# Patient Record
Sex: Female | Born: 1990 | Race: Black or African American | Hispanic: No | Marital: Single | Smoking: Current every day smoker
Health system: Southern US, Community
[De-identification: ages and names within clinical notes are randomized; demographics above are authoritative.]

## PROBLEM LIST (undated history)

## (undated) HISTORY — PX: NO PAST SURGERIES: SHX2092

## (undated) HISTORY — PX: CHOLECYSTECTOMY: SHX55

---
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
## 2010-05-05 LAB — URINALYSIS, ROUTINE W REFLEX MICROSCOPIC
Glucose, UA: NEGATIVE mg/dL
Nitrite: NEGATIVE
pH: 7 (ref 5.0–8.0)

## 2010-05-10 LAB — COMPREHENSIVE METABOLIC PANEL
ALT: 11 U/L (ref 0–35)
ALT: 17 U/L (ref 0–35)
Albumin: 4.7 g/dL (ref 3.5–5.2)
Alkaline Phosphatase: 59 U/L (ref 39–117)
Alkaline Phosphatase: 69 U/L (ref 39–117)
BUN: 5 mg/dL — ABNORMAL LOW (ref 6–23)
BUN: 6 mg/dL (ref 6–23)
BUN: 7 mg/dL (ref 6–23)
Calcium: 9.4 mg/dL (ref 8.4–10.5)
Chloride: 107 mEq/L (ref 96–112)
Creatinine, Ser: 0.63 mg/dL (ref 0.4–1.2)
GFR calc non Af Amer: >60 mL/min (ref >60)
Glucose, Bld: 102 mg/dL — ABNORMAL HIGH (ref 70–99)
Glucose, Bld: 110 mg/dL — ABNORMAL HIGH (ref 70–99)
Potassium: 3.3 mEq/L — ABNORMAL LOW (ref 3.5–5.1)
Potassium: 3 mEq/L — ABNORMAL LOW (ref 3.5–5.1)
Sodium: 138 mEq/L (ref 135–145)
Sodium: 138 mEq/L (ref 135–145)
Sodium: 140 mEq/L (ref 135–145)
Total Bilirubin: 2 mg/dL — ABNORMAL HIGH (ref 0.3–1.2)
Total Protein: 7 g/dL (ref 6.0–8.3)
Total Protein: 7.1 g/dL (ref 6.0–8.3)
Total Protein: 8 g/dL (ref 6.0–8.3)

## 2010-05-10 LAB — DIFFERENTIAL
Basophils Absolute: 0 10*3/uL (ref 0.0–0.1)
Basophils Relative: 0 % (ref 0–1)
Basophils Relative: 0 % (ref 0–1)
Eosinophils Absolute: 0 10*3/uL (ref 0.0–0.7)
Lymphocytes Relative: 8 % — ABNORMAL LOW (ref 12–46)
Lymphocytes Relative: 7 % — ABNORMAL LOW (ref 12–46)
Lymphs Abs: 0.9 10*3/uL (ref 0.7–4.0)
Monocytes Absolute: 0.5 10*3/uL (ref 0.1–1.0)
Monocytes Absolute: 0.4 10*3/uL (ref 0.1–1.0)
Monocytes Relative: 3 % (ref 3–12)
Monocytes Relative: 4 % (ref 3–12)
Monocytes Relative: 2 % — ABNORMAL LOW (ref 3–12)
Neutro Abs: 10.4 10*3/uL — ABNORMAL HIGH (ref 1.7–7.7)
Neutro Abs: 12.6 10*3/uL — ABNORMAL HIGH (ref 1.7–7.7)
Neutro Abs: 16.3 10*3/uL — ABNORMAL HIGH (ref 1.7–7.7)
Neutrophils Relative %: 87 % — ABNORMAL HIGH (ref 43–77)
Neutrophils Relative %: 89 % — ABNORMAL HIGH (ref 43–77)

## 2010-05-10 LAB — CBC
HCT: 37.6 % (ref 36.0–46.0)
HCT: 39.8 % (ref 36.0–46.0)
Hemoglobin: 12.9 g/dL (ref 12.0–15.0)
Hemoglobin: 12.9 g/dL (ref 12.0–15.0)
MCHC: 34.2 g/dL (ref 30.0–36.0)
MCV: 89.9 fL (ref 78.0–100.0)
Platelets: 316 10*3/uL (ref 150–400)
RBC: 4.18 MIL/uL (ref 3.87–5.11)
RDW: 14 % (ref 11.5–15.5)
RDW: 13.8 % (ref 11.5–15.5)
WBC: 14.5 10*3/uL — ABNORMAL HIGH (ref 4.0–10.5)

## 2010-05-10 LAB — URINALYSIS, ROUTINE W REFLEX MICROSCOPIC
Bilirubin Urine: NEGATIVE
Glucose, UA: NEGATIVE mg/dL
Glucose, UA: NEGATIVE mg/dL
Hgb urine dipstick: NEGATIVE
Ketones, ur: >80 mg/dL — AB
Ketones, ur: 40 mg/dL — AB
Leukocytes, UA: NEGATIVE
Nitrite: POSITIVE — AB
Protein, ur: 30 mg/dL — AB
Protein, ur: NEGATIVE mg/dL
Protein, ur: 100 mg/dL — AB
Specific Gravity, Urine: 1.02 (ref 1.005–1.030)
Urobilinogen, UA: 0.2 mg/dL (ref 0.0–1.0)
Urobilinogen, UA: 1 mg/dL (ref 0.0–1.0)
pH: >9 — ABNORMAL HIGH (ref 5.0–8.0)

## 2010-05-10 LAB — LIPASE, BLOOD: Lipase: 17 U/L (ref 11–59)

## 2010-05-10 LAB — URINE MICROSCOPIC-ADD ON

## 2010-05-10 LAB — RAPID URINE DRUG SCREEN, HOSP PERFORMED
Amphetamines: NOT DETECTED
Barbiturates: NOT DETECTED
Benzodiazepines: POSITIVE — AB
Cocaine: NOT DETECTED
Opiates: POSITIVE — AB

## 2010-05-10 LAB — PREGNANCY, URINE: Preg Test, Ur: NEGATIVE

## 2010-05-10 LAB — AMYLASE: Amylase: 95 U/L (ref 27–131)

## 2010-05-16 LAB — URINALYSIS, ROUTINE W REFLEX MICROSCOPIC
Glucose, UA: NEGATIVE mg/dL
Specific Gravity, Urine: >1.03 — ABNORMAL HIGH (ref 1.005–1.030)
pH: 5.5 (ref 5.0–8.0)

## 2010-05-16 LAB — URINE MICROSCOPIC-ADD ON

## 2010-05-16 LAB — URINE CULTURE

## 2010-10-19 LAB — URINALYSIS, ROUTINE W REFLEX MICROSCOPIC
Glucose, UA: NEGATIVE
Hgb urine dipstick: NEGATIVE
Specific Gravity, Urine: >1.03 — ABNORMAL HIGH

## 2010-10-19 LAB — RAPID STREP SCREEN (MED CTR MEBANE ONLY)

## 2010-11-15 LAB — COMPREHENSIVE METABOLIC PANEL
ALT: 13
Albumin: 4.4
Alkaline Phosphatase: 73
BUN: 8
Chloride: 106
Glucose, Bld: 111 — ABNORMAL HIGH
Potassium: 3.4 — ABNORMAL LOW
Sodium: 137
Total Bilirubin: 1.6 — ABNORMAL HIGH

## 2010-11-15 LAB — CBC
HCT: 35.3 — ABNORMAL LOW
Hemoglobin: 12.2
Platelets: 311
WBC: 12.6 — ABNORMAL HIGH

## 2010-11-15 LAB — URINALYSIS, ROUTINE W REFLEX MICROSCOPIC
Bilirubin Urine: NEGATIVE
Glucose, UA: NEGATIVE
Hgb urine dipstick: NEGATIVE
Ketones, ur: >80 — AB
Protein, ur: 30 — AB
Urobilinogen, UA: 0.2

## 2010-11-15 LAB — DIFFERENTIAL
Basophils Absolute: 0
Basophils Relative: 0
Eosinophils Absolute: 0
Monocytes Absolute: 0.7
Neutro Abs: 10.7 — ABNORMAL HIGH

## 2011-01-18 IMAGING — US US PELVIS COMPLETE
1 series · 13 of 25 positions shown · non-contrast
Comparison: None.
COMPARISON: None.

CLINICAL DATA: Abdominal pain.  Vomiting and chills.

ABDOMEN ULTRASOUND
TECHNIQUE: Complete abdominal ultrasound examination was performed
including evaluation of the liver, gallbladder, bile ducts,
pancreas, kidneys, spleen, IVC, and abdominal aorta.
CLINICAL DATA: Pelvic pain.
TRANSABDOMINAL AND TRANSVAGINAL ULTRASOUND OF PELVIS
TECHNIQUE: Both transabdominal and transvaginal ultrasound
examinations of the pelvis were performed including evaluation of
the uterus, ovaries, adnexal regions, and pelvic cul-de-sac.

[Series 1: unknown · 0.26mm/px · 13 of 47 slices shown]
[im 1/47]
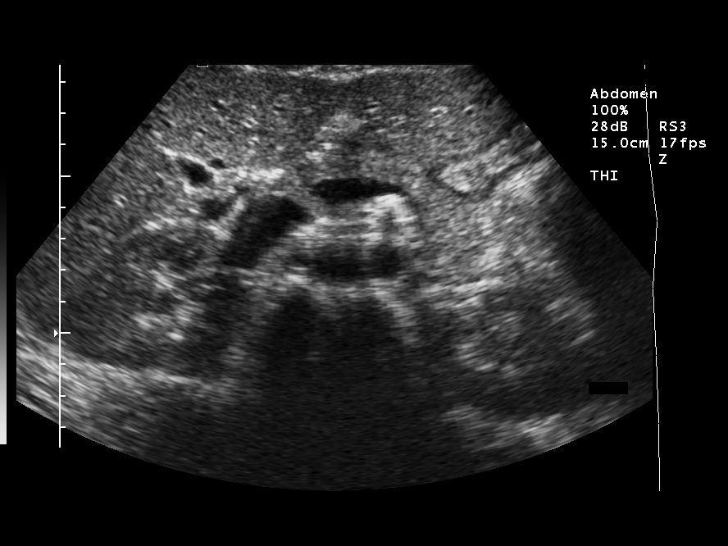
[im 4/47]
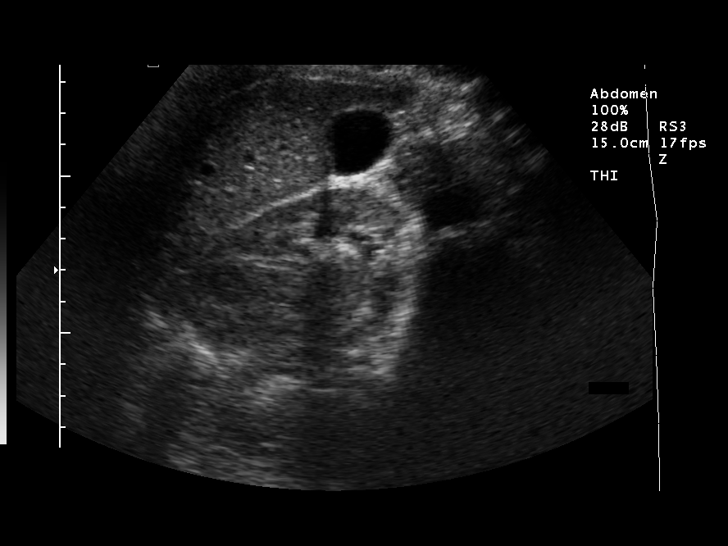
[im 8/47]
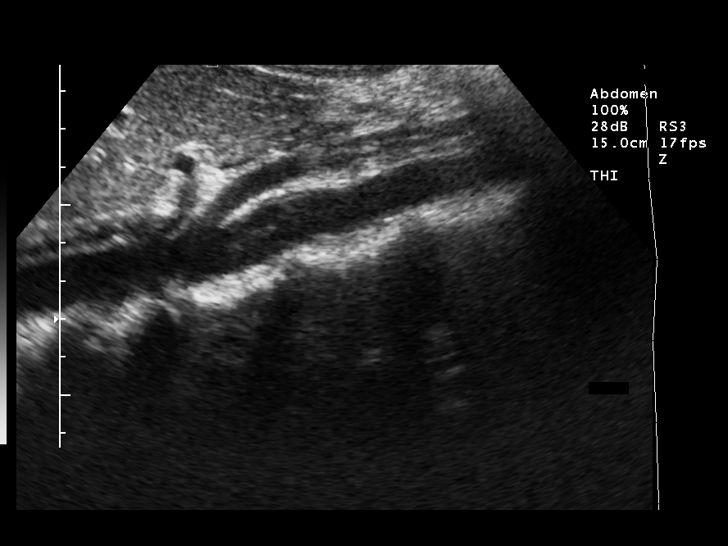
[im 12/47]
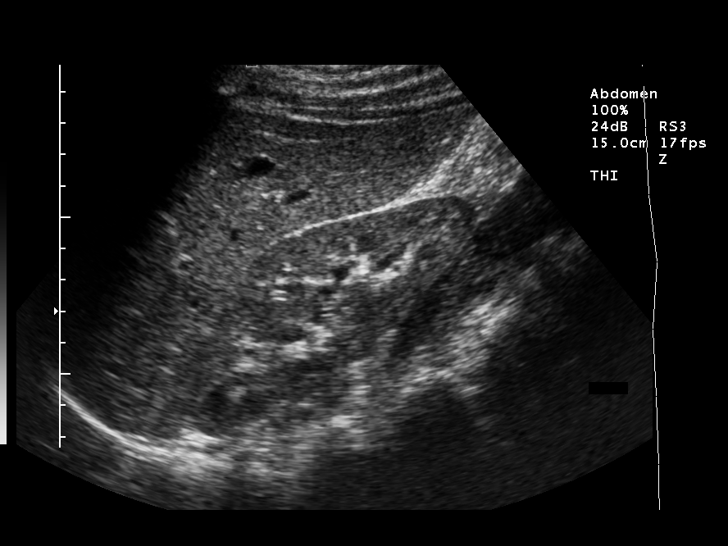
[im 16/47]
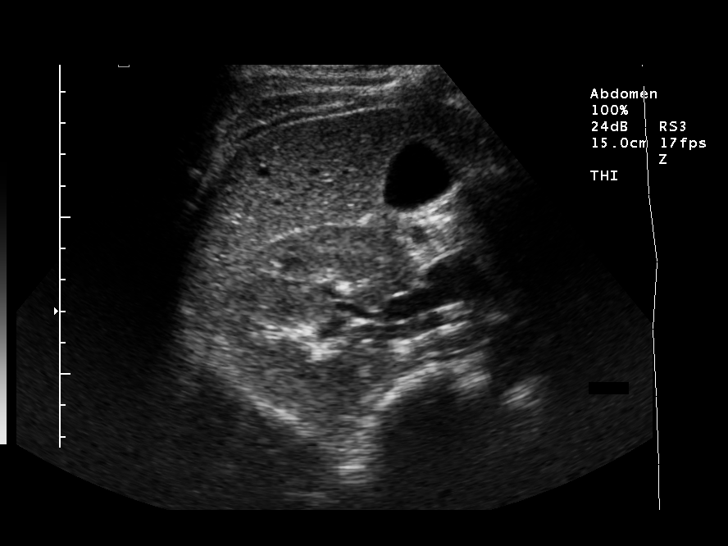
[im 20/47]
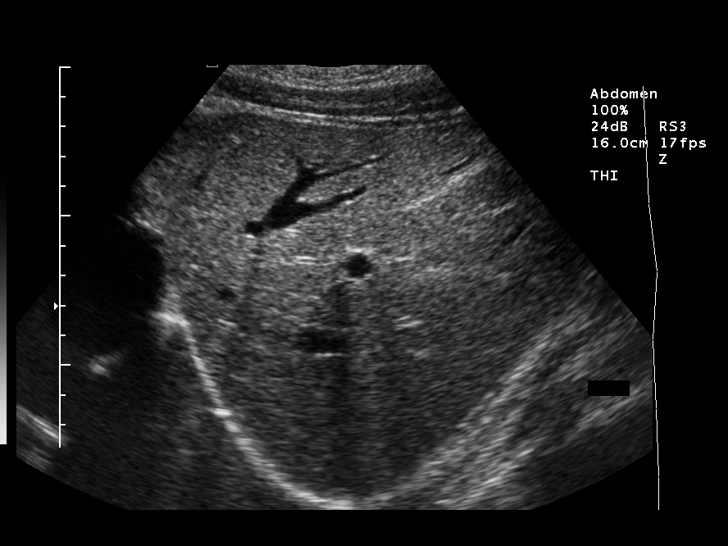
[im 24/47]
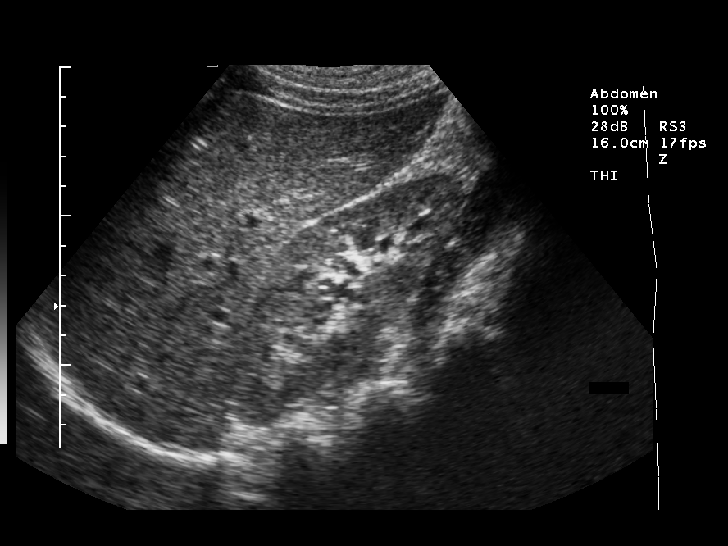
[im 27/47]
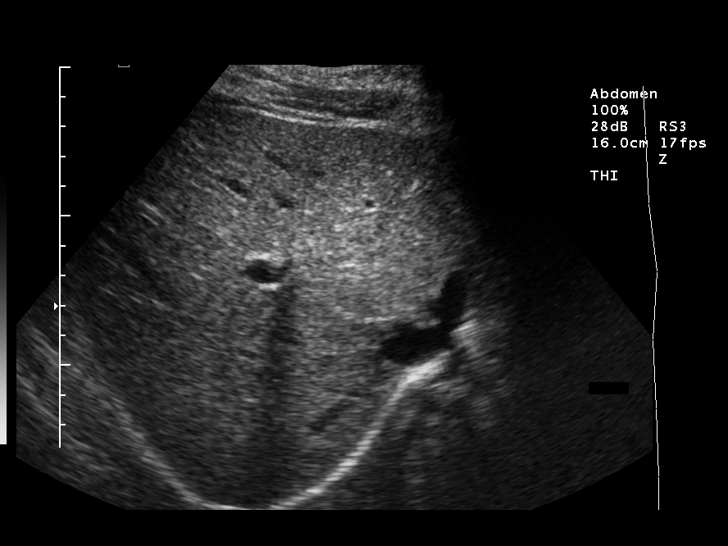
[im 31/47]
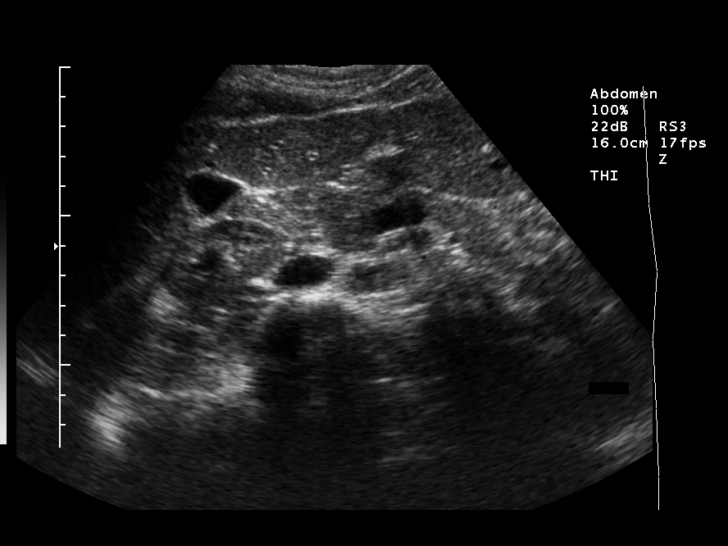
[im 35/47]
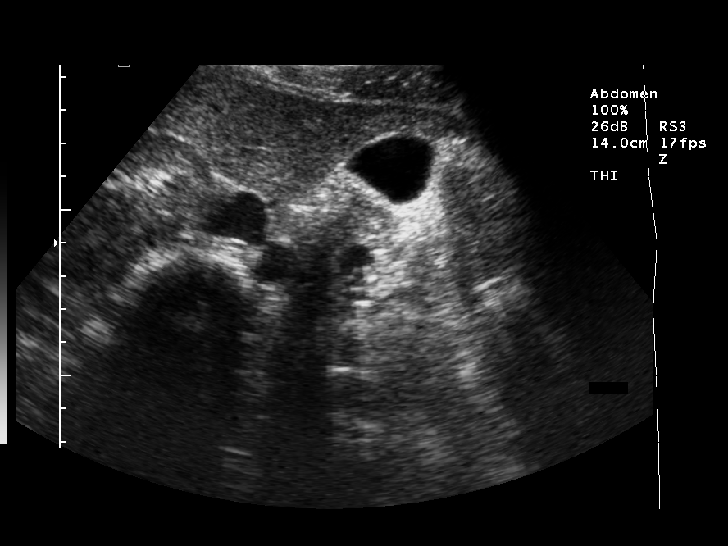
[im 39/47]
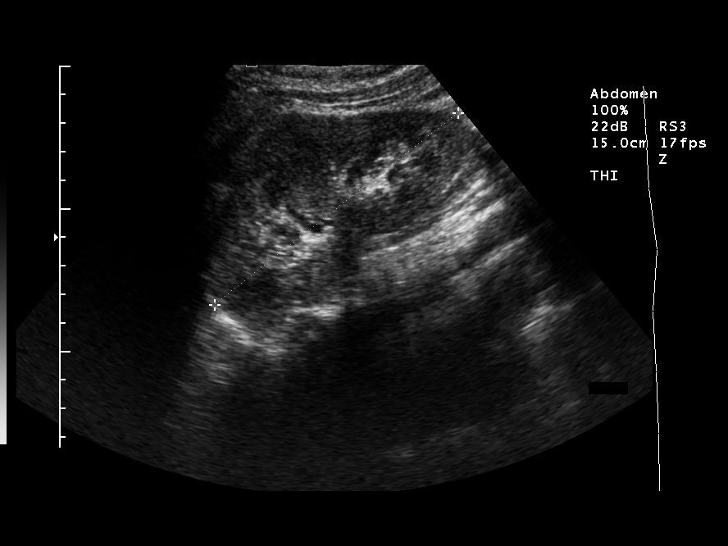
[im 43/47]
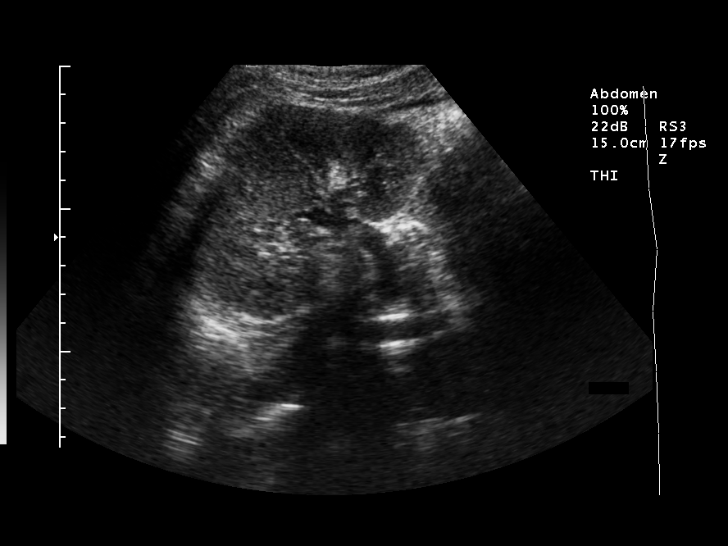
[im 47/47]
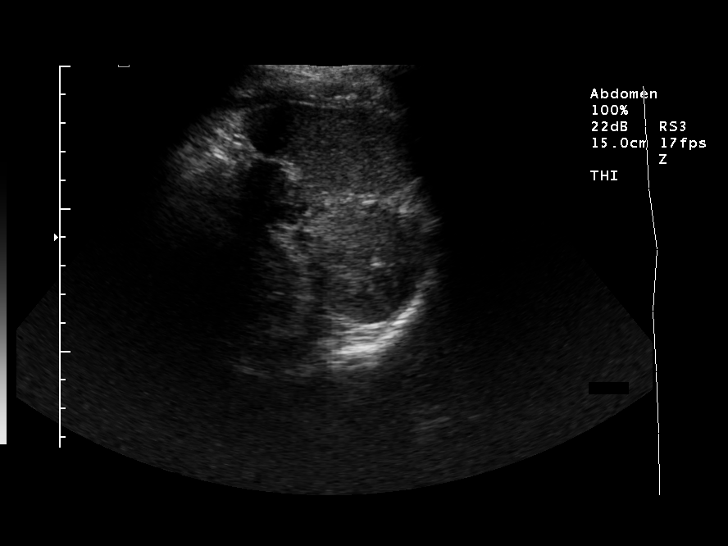

[13 of 25 positions shown; findings below may reference images not displayed]

FINDINGS: Gallbladder:  There is no evidence for gallstones, gallbladder wall
thickening or pericholecystic fluid.  The sonographer reports no
sonographic Murphy's sign.

Common Bile Duct:  Nondilated at 2 mm diameter

Liver:  Normal

IVC:  Normal

Pancreas:  Normal

Spleen:  Normal

Right kidney:  1.2 cm in long axis.  Normal

Left kidney:  10.8 cm in long axis.  Normal

Abdominal Aorta:  No aneurysm
IMPRESSION: Normal abdominal ultrasound.

CT would be a more sensitive means to evaluate the bowel and
peritoneal cavity.
FINDINGS: The uterus measures 6.1 x 3.3 x 3.9 cm.  Myometrial echotexture is
homogeneous.  No evidence for fibroids.  Endometrial stripe
thickness is 5 mm.

The right ovary measures 3.4 x 2.0 x 2.2 cm.  The left ovary
measures 1.8 x 1.7 x 2.7 cm.  Both ovaries are sonographically
normal.  No evidence for intraperitoneal free fluid.
IMPRESSION: Normal pelvic ultrasound.

## 2011-03-27 ENCOUNTER — Encounter (HOSPITAL_COMMUNITY): Payer: Self-pay | Admitting: *Deleted

## 2011-03-27 ENCOUNTER — Emergency Department (HOSPITAL_COMMUNITY)
Admission: EM | Admit: 2011-03-27 | Discharge: 2011-03-27 | Disposition: A | Discharge status: Home Health | Payer: Self-pay | Attending: Emergency Medicine | Admitting: Emergency Medicine

## 2011-03-27 DIAGNOSIS — H5710 Ocular pain, unspecified eye: Secondary | ICD-10-CM | POA: Insufficient documentation

## 2011-03-27 DIAGNOSIS — H5789 Other specified disorders of eye and adnexa: Secondary | ICD-10-CM | POA: Insufficient documentation

## 2011-03-27 DIAGNOSIS — F17200 Nicotine dependence, unspecified, uncomplicated: Secondary | ICD-10-CM | POA: Insufficient documentation

## 2011-03-27 DIAGNOSIS — H00019 Hordeolum externum unspecified eye, unspecified eyelid: Secondary | ICD-10-CM | POA: Insufficient documentation

## 2011-03-27 MED ORDER — TOBRAMYCIN 0.3 % OP OINT
TOPICAL_OINTMENT | Freq: Three times a day (TID) | OPHTHALMIC | Status: DC
  Administered 2011-03-27: 16:00:00 via OPHTHALMIC
  Filled 2011-03-27 (×3): qty 3.5

## 2011-03-27 MED ORDER — TOBRAMYCIN 0.3 % OP SOLN
OPHTHALMIC | Status: AC
  Filled 2011-03-27: qty 5

## 2011-03-27 NOTE — ED Notes (Signed)
Right eye pain that started last night.  Swelling this morning.  Denies drainage, denies visual disturbances.

## 2011-03-27 NOTE — ED Notes (Signed)
Awaiting medication from pharmacy.

## 2011-03-27 NOTE — ED Provider Notes (Signed)
History     CSN: [id number]  Arrival date & time 03/27/11  1456   First MD Initiated Contact with Patient 03/27/11 1523      Chief Complaint  Patient presents with  . Eye Pain    (Consider location/radiation/quality/duration/timing/severity/associated sxs/prior treatment) Patient is a 21 y.o. female presenting with eye pain. The history is provided by the patient.  Eye Pain This is a new problem. The current episode started yesterday. The problem occurs constantly. The problem has been gradually worsening. Pertinent negatives include no abdominal pain, arthralgias, chest pain, congestion, coughing, fever, headaches, joint swelling, nausea, neck pain, numbness, rash, sore throat, swollen glands or weakness. Associated symptoms comments: Swelling of right upper eyelid.  She denies foreign body sensation.  No discharge from either eye.. Exacerbated by: Eyelid is tender to palpation, blinking also increases discomfort. She has tried nothing for the symptoms.    History reviewed. No pertinent past medical history.  History reviewed. No pertinent past surgical history.  No family history on file.  History  Substance Use Topics  . Smoking status: Current Everyday Smoker    Types: Cigarettes  . Smokeless tobacco: Not on file  . Alcohol Use: No    OB History    Grav Para Term Preterm Abortions TAB SAB Ect Mult Living                  Review of Systems  Constitutional: Negative for fever.  HENT: Negative for congestion, sore throat and neck pain.   Eyes: Positive for pain. Negative for photophobia, discharge, redness, itching and visual disturbance.       Eyelid pain and swelling.  Respiratory: Negative for cough, chest tightness and shortness of breath.   Cardiovascular: Negative for chest pain.  Gastrointestinal: Negative for nausea and abdominal pain.  Genitourinary: Negative.   Musculoskeletal: Negative for joint swelling and arthralgias.  Skin: Negative.  Negative for  rash and wound.  Neurological: Negative for dizziness, weakness, light-headedness, numbness and headaches.  Hematological: Negative.   Psychiatric/Behavioral: Negative.     Allergies  Review of patient's allergies indicates no known allergies.  Home Medications   Current Outpatient Rx  Name Route Sig Dispense Refill  . TRIAMCINOLONE ACETONIDE 0.1 % EX CREA Topical Apply 1 application topically 2 (two) times daily.      BP 90/63  Pulse 89  Temp(Src) 97.8 F (36.6 C) (Oral)  Resp 18  Ht 5\' 2"  (1.575 m)  Wt 125 lb (56.7 kg)  BMI 22.86 kg/m2  SpO2 100%  Physical Exam  Nursing note and vitals reviewed. Constitutional: She is oriented to person, place, and time. She appears well-developed and well-nourished. No distress.  HENT:  Head: Normocephalic and atraumatic.  Eyes: Conjunctivae and EOM are normal. Pupils are equal, round, and reactive to light. Right eye exhibits hordeolum. Right eye exhibits no discharge. Left eye exhibits no discharge.         Visual acuity 20/15 OS/OD  Neck: Normal range of motion.  Cardiovascular: Normal rate, regular rhythm, normal heart sounds and intact distal pulses.   Pulmonary/Chest: Effort normal and breath sounds normal. She has no wheezes.  Abdominal: Soft. Bowel sounds are normal. There is no tenderness.  Musculoskeletal: Normal range of motion.  Neurological: She is alert and oriented to person, place, and time.  Skin: Skin is warm and dry.  Psychiatric: She has a normal mood and affect.    ED Course  Procedures (including critical care time)  Labs Reviewed - No data to  display No results found.   1. Stye       MDM  Encouraged warm compresses and washing eyelids with baby shampoo.  Given Tobrex ophthalmic ointment to apply a thin coat along the eyelid margins.  Referral to Dr. Bing Plume of ophthalmology if symptoms do not improve or if they worsen over the next week.        Candis Musa, PA 03/27/11 1716  Candis Musa,  PA 03/27/11 [phone]

## 2011-03-28 NOTE — ED Provider Notes (Signed)
Medical screening examination/treatment/procedure(s) were performed by non-physician practitioner and as supervising physician I was immediately available for consultation/collaboration.  Zawadi Aplin T Raye Wiens, MD 03/28/11 2005 

## 2011-05-07 ENCOUNTER — Emergency Department (HOSPITAL_COMMUNITY): Payer: No Typology Code available for payment source

## 2011-05-07 ENCOUNTER — Encounter (HOSPITAL_COMMUNITY): Payer: Self-pay | Admitting: *Deleted

## 2011-05-07 ENCOUNTER — Emergency Department (HOSPITAL_COMMUNITY)
Admission: EM | Admit: 2011-05-07 | Discharge: 2011-05-07 | Disposition: A | Discharge status: Home / Self Care | Payer: No Typology Code available for payment source | Attending: Emergency Medicine | Admitting: Emergency Medicine

## 2011-05-07 DIAGNOSIS — S9030XA Contusion of unspecified foot, initial encounter: Secondary | ICD-10-CM

## 2011-05-07 DIAGNOSIS — Y92410 Unspecified street and highway as the place of occurrence of the external cause: Secondary | ICD-10-CM | POA: Insufficient documentation

## 2011-05-07 MED ORDER — ACETAMINOPHEN 500 MG PO TABS
1000.0000 mg | ORAL_TABLET | Freq: Once | ORAL | Status: AC
  Administered 2011-05-07: 1000 mg via ORAL
  Filled 2011-05-07: qty 2
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
## 2011-05-08 NOTE — ED Provider Notes (Signed)
Medical screening examination/treatment/procedure(s) were performed by non-physician practitioner and as supervising physician I was immediately available for consultation/collaboration. Devoria Albe, MD, FACEP   Ward Givens, MD 05/08/11 [phone]

## 2011-06-13 ENCOUNTER — Emergency Department (HOSPITAL_COMMUNITY)
Admission: EM | Admit: 2011-06-13 | Discharge: 2011-06-13 | Disposition: A | Discharge status: Home / Self Care | Payer: Self-pay | Attending: Emergency Medicine | Admitting: Emergency Medicine

## 2011-06-13 ENCOUNTER — Encounter (HOSPITAL_COMMUNITY): Payer: Self-pay

## 2011-06-13 DIAGNOSIS — R112 Nausea with vomiting, unspecified: Secondary | ICD-10-CM | POA: Insufficient documentation

## 2011-06-13 DIAGNOSIS — R1013 Epigastric pain: Secondary | ICD-10-CM | POA: Insufficient documentation

## 2011-06-13 LAB — COMPREHENSIVE METABOLIC PANEL
ALT: 21 U/L (ref 0–35)
AST: 22 U/L (ref 0–37)
Alkaline Phosphatase: 66 U/L (ref 39–117)
CO2: 22 mEq/L (ref 19–32)
GFR calc Af Amer: >90 mL/min (ref >90)
GFR calc non Af Amer: >90 mL/min (ref >90)
Glucose, Bld: 78 mg/dL (ref 70–99)
Potassium: 3.5 mEq/L (ref 3.5–5.1)
Sodium: 138 mEq/L (ref 135–145)

## 2011-06-13 LAB — URINALYSIS, ROUTINE W REFLEX MICROSCOPIC
Glucose, UA: NEGATIVE mg/dL
Leukocytes, UA: NEGATIVE
Specific Gravity, Urine: >1.03 — ABNORMAL HIGH (ref 1.005–1.030)
pH: 6 (ref 5.0–8.0)

## 2011-06-13 LAB — DIFFERENTIAL
Lymphocytes Relative: 16 % (ref 12–46)
Lymphs Abs: 2.2 10*3/uL (ref 0.7–4.0)
Neutro Abs: 10.5 10*3/uL — ABNORMAL HIGH (ref 1.7–7.7)
Neutrophils Relative %: 78 % — ABNORMAL HIGH (ref 43–77)

## 2011-06-13 LAB — CBC
Platelets: 314 10*3/uL (ref 150–400)
RBC: 5.13 MIL/uL — ABNORMAL HIGH (ref 3.87–5.11)
WBC: 13.4 10*3/uL — ABNORMAL HIGH (ref 4.0–10.5)

## 2011-06-13 LAB — URINE MICROSCOPIC-ADD ON

## 2011-06-13 LAB — PREGNANCY, URINE: Preg Test, Ur: NEGATIVE

## 2011-06-13 MED ORDER — SODIUM CHLORIDE 0.9 % IV BOLUS (SEPSIS)
1000.0000 mL | Freq: Once | INTRAVENOUS | Status: AC
  Administered 2011-06-13: 1000 mL via INTRAVENOUS

## 2011-06-13 MED ORDER — ONDANSETRON HCL 4 MG/2ML IJ SOLN
4.0000 mg | Freq: Once | INTRAMUSCULAR | Status: AC
  Administered 2011-06-13: 4 mg via INTRAVENOUS
  Filled 2011-06-13: qty 2

## 2011-06-13 MED ORDER — ONDANSETRON HCL 8 MG PO TABS: 8.0000 mg | ORAL_TABLET | ORAL | Status: AC | PRN

## 2011-06-13 MED ORDER — PANTOPRAZOLE SODIUM 40 MG IV SOLR
40.0000 mg | Freq: Once | INTRAVENOUS | Status: AC
  Administered 2011-06-13: 40 mg via INTRAVENOUS
  Filled 2011-06-13: qty 40

## 2011-06-13 NOTE — ED Notes (Signed)
Family of pt has came out fussing about wait. Advised 2nd doctor here and pt should be next to be seen,. Pt is not complaining. Comforted pt /family. Nad. Urine obtained

## 2011-06-13 NOTE — ED Provider Notes (Signed)
This chart was scribed for Donnetta Hutching, MD by Williemae Natter. The patient was seen in room [facility]18/[facility]18 at 2:24 PM.  History     CSN: [id number]  Arrival date & time 06/13/11  1116   First MD Initiated Contact with Patient 06/13/11 1359      Chief Complaint  Patient presents with  . Emesis    (Consider location/radiation/quality/duration/timing/severity/associated sxs/prior treatment) Patient is a 21 y.o. female presenting with vomiting. The history is provided by the patient and a parent. No language interpreter was used.  Emesis  This is a new problem. The current episode started more than 2 days ago. The problem occurs 2 to 4 times per day. The problem has not changed since onset.The emesis has an appearance of stomach contents. There has been no fever. Associated symptoms include abdominal pain. Pertinent negatives include no fever.   Marissa Boyle is a 21 y.o. female who presents to the Emergency Department complaining of vomiting. Pt went to a [employer] out 4 days ago and was sick for two days. Pt felt better then felt sick again with associated epigastric abdominal pain and vomiting. LMP 1 month ago.   History reviewed. No pertinent past medical history.  History reviewed. No pertinent past surgical history.  No family history on file.  History  Substance Use Topics  . Smoking status: Current Everyday Smoker    Types: Cigarettes  . Smokeless tobacco: Not on file  . Alcohol Use: No    OB History    Grav Para Term Preterm Abortions TAB SAB Ect Mult Living                  Review of Systems  Constitutional: Negative for fever.  Gastrointestinal: Positive for nausea, vomiting and abdominal pain.  All other systems reviewed and are negative.    Allergies  Review of patient's allergies indicates no known allergies.  Home Medications  No current outpatient prescriptions on file.  BP 102/48  Pulse 87  Temp 97.7 F (36.5 C)  Resp 18  Ht 5\' 2"  (1.575 m)  Wt 125  lb (56.7 kg)  BMI 22.86 kg/m2  SpO2 100%  Physical Exam  Nursing note and vitals reviewed. Constitutional: She is oriented to person, place, and time. She appears well-developed and well-nourished.  HENT:  Head: Normocephalic and atraumatic.  Eyes: EOM are normal. Pupils are equal, round, and reactive to light.  Neck: Normal range of motion. Neck supple.  Pulmonary/Chest: No respiratory distress.  Abdominal: She exhibits no distension. There is tenderness (epigastric tenderness).  Musculoskeletal: Normal range of motion. She exhibits no edema.  Neurological: She is alert and oriented to person, place, and time. She exhibits normal muscle tone.  Skin: Skin is warm and dry.  Psychiatric: She has a normal mood and affect. Her behavior is normal.    ED Course  Procedures (including critical care time) DIAGNOSTIC STUDIES: Oxygen Saturation is 100% on room air, normal by my interpretation.    COORDINATION OF CARE: Medications - No data to display    Labs Reviewed  URINALYSIS, ROUTINE W REFLEX MICROSCOPIC - Abnormal; Notable for the following:    Specific Gravity, Urine >1.030 (*)    Hgb urine dipstick TRACE (*)    Ketones, ur 40 (*)    All other components within normal limits  CBC - Abnormal; Notable for the following:    WBC 13.4 (*)    RBC 5.13 (*)    Hemoglobin 15.4 (*)    All other  components within normal limits  DIFFERENTIAL - Abnormal; Notable for the following:    Neutrophils Relative 78 (*)    Neutro Abs 10.5 (*)    All other components within normal limits  COMPREHENSIVE METABOLIC PANEL - Abnormal; Notable for the following:    Total Protein 8.7 (*)    All other components within normal limits  URINE MICROSCOPIC-ADD ON - Abnormal; Notable for the following:    Squamous Epithelial / LPF MANY (*)    Bacteria, UA MANY (*)    All other components within normal limits  PREGNANCY, URINE  LIPASE, BLOOD   No results found.   No diagnosis found.    MDM    History and physical suggestive of gastroenteritis. Patient feeling much better after IV hydration. no acute abdomen on discharge  I personally performed the services described in this documentation, which was scribed in my presence. The recorded information has been reviewed and considered.        Donnetta Hutching, MD 06/13/11 [phone]

## 2011-06-13 NOTE — ED Notes (Signed)
Pt states she has had n/v off and on since Saturday

## 2011-06-13 NOTE — ED Notes (Signed)
Patient states she is feeling better and is ready to go home. 

## 2011-06-13 NOTE — ED Notes (Signed)
Patient states she is still feeling the same pain wise.

## 2011-06-13 NOTE — Discharge Instructions (Signed)
Clear liquids tonight. Medication for nausea.

## 2011-08-19 ENCOUNTER — Encounter (HOSPITAL_COMMUNITY): Payer: Self-pay | Admitting: Emergency Medicine

## 2011-08-19 ENCOUNTER — Emergency Department (HOSPITAL_COMMUNITY)
Admission: EM | Admit: 2011-08-19 | Discharge: 2011-08-19 | Disposition: A | Discharge status: Home / Self Care | Payer: Self-pay | Attending: Emergency Medicine | Admitting: Emergency Medicine

## 2011-08-19 DIAGNOSIS — F17200 Nicotine dependence, unspecified, uncomplicated: Secondary | ICD-10-CM | POA: Insufficient documentation

## 2011-08-19 DIAGNOSIS — N390 Urinary tract infection, site not specified: Secondary | ICD-10-CM | POA: Insufficient documentation

## 2011-08-19 LAB — URINALYSIS, ROUTINE W REFLEX MICROSCOPIC
Glucose, UA: NEGATIVE mg/dL
Nitrite: POSITIVE — AB
Protein, ur: NEGATIVE mg/dL
pH: 6 (ref 5.0–8.0)

## 2011-08-19 LAB — PREGNANCY, URINE: Preg Test, Ur: NEGATIVE

## 2011-08-19 LAB — URINE MICROSCOPIC-ADD ON

## 2011-08-19 MED ORDER — KETOROLAC TROMETHAMINE 60 MG/2ML IM SOLN
60.0000 mg | Freq: Once | INTRAMUSCULAR | Status: AC
  Administered 2011-08-19: 60 mg via INTRAMUSCULAR
  Filled 2011-08-19: qty 2

## 2011-08-19 MED ORDER — IBUPROFEN 600 MG PO TABS: 600.0000 mg | ORAL_TABLET | Freq: Four times a day (QID) | ORAL | Status: AC | PRN

## 2011-08-19 MED ORDER — NITROFURANTOIN MONOHYD MACRO 100 MG PO CAPS: 100.0000 mg | ORAL_CAPSULE | Freq: Two times a day (BID) | ORAL | Status: AC

## 2011-08-19 MED ORDER — NITROFURANTOIN MONOHYD MACRO 100 MG PO CAPS
100.0000 mg | ORAL_CAPSULE | Freq: Once | ORAL | Status: AC
  Administered 2011-08-19: 100 mg via ORAL
  Filled 2011-08-19: qty 1

## 2011-08-19 NOTE — ED Notes (Signed)
Pt c/o lower back pain x 2 days. Pt states she was moving furniture last week.

## 2011-08-20 NOTE — ED Provider Notes (Signed)
Medical screening examination/treatment/procedure(s) were performed by non-physician practitioner and as supervising physician I was immediately available for consultation/collaboration.   Laray Anger, DO 08/20/11 1529

## 2011-08-20 NOTE — ED Provider Notes (Signed)
History     CSN: [id number]  Arrival date & time 08/19/11  1320   First MD Initiated Contact with Patient 08/19/11 1347      Chief Complaint  Patient presents with  . Back Pain    (Consider location/radiation/quality/duration/timing/severity/associated sxs/prior treatment) HPI Comments: Marissa Boyle  presents with acute low back pain which has which has been present for the past 2 days.   Patient was helping to move furniture last week, then she woke 2 days ago with pain across her lower back.  There is no radiation into her lower extremities.  There has been no weakness or numbness in the lower extremities and no urinary or bowel retention or incontinence.   She denies pain with urination and urinary frequency.   Patient does not have a history of cancer or IVDU.   The history is provided by the patient.    History reviewed. No pertinent past medical history.  History reviewed. No pertinent past surgical history.  No family history on file.  History  Substance Use Topics  . Smoking status: Current Everyday Smoker    Types: Cigarettes  . Smokeless tobacco: Not on file  . Alcohol Use: No    OB History    Grav Para Term Preterm Abortions TAB SAB Ect Mult Living                  Review of Systems  Constitutional: Negative for fever.  HENT: Negative for congestion, sore throat and neck pain.   Eyes: Negative.   Respiratory: Negative for chest tightness and shortness of breath.   Cardiovascular: Negative for chest pain.  Gastrointestinal: Negative for nausea, vomiting and abdominal pain.  Genitourinary: Negative for urgency, hematuria, vaginal discharge and difficulty urinating.  Musculoskeletal: Positive for back pain. Negative for arthralgias.  Skin: Negative.  Negative for rash and wound.  Neurological: Negative for dizziness, weakness, light-headedness, numbness and headaches.  Hematological: Negative.   Psychiatric/Behavioral: Negative.     Allergies    Review of patient's allergies indicates no known allergies.  Home Medications   Current Outpatient Rx  Name Route Sig Dispense Refill  . IBUPROFEN 600 MG PO TABS Oral Take 1 tablet (600 mg total) by mouth every 6 (six) hours as needed for pain. 30 tablet 0  . NITROFURANTOIN MONOHYD MACRO 100 MG PO CAPS Oral Take 1 capsule (100 mg total) by mouth 2 (two) times daily. 10 capsule 0    BP 111/71  Pulse 93  Temp 98.4 F (36.9 C) (Oral)  Resp 20  Ht 5\' 2"  (1.575 m)  Wt 125 lb (56.7 kg)  BMI 22.86 kg/m2  SpO2 100%  LMP 07/30/2011  Physical Exam  Nursing note and vitals reviewed. Constitutional: She appears well-developed and well-nourished.  HENT:  Head: Normocephalic.  Eyes: Conjunctivae are normal.  Neck: Normal range of motion. Neck supple.  Cardiovascular: Normal rate and intact distal pulses.        Pedal pulses normal.  Pulmonary/Chest: Effort normal.  Abdominal: Soft. Bowel sounds are normal. She exhibits no distension and no mass.  Musculoskeletal: Normal range of motion. She exhibits no edema.       Lumbar back: She exhibits tenderness. She exhibits no swelling, no edema and no spasm.       Bilateral paralumber ttp.  Neurological: She is alert. She has normal strength. She displays no atrophy and no tremor. No sensory deficit. Gait normal.  Reflex Scores:      Patellar reflexes are 2+ on  the right side and 2+ on the left side.      Achilles reflexes are 2+ on the right side and 2+ on the left side.      No strength deficit noted in hip and knee flexor and extensor muscle groups.  Ankle flexion and extension intact.  Skin: Skin is warm and dry.  Psychiatric: She has a normal mood and affect.    ED Course  Procedures (including critical care time)  Labs Reviewed  URINALYSIS, ROUTINE W REFLEX MICROSCOPIC - Abnormal; Notable for the following:    APPearance CLOUDY (*)     Hgb urine dipstick MODERATE (*)     Nitrite POSITIVE (*)     Leukocytes, UA SMALL (*)     All  other components within normal limits  URINE MICROSCOPIC-ADD ON - Abnormal; Notable for the following:    Squamous Epithelial / LPF FEW (*)     Bacteria, UA MANY (*)     All other components within normal limits  PREGNANCY, URINE  URINE CULTURE   No results found.   1. UTI (lower urinary tract infection)    toradol 60 IM injection given in ed. Macrobid tab given.   MDM  Reviewed labs with pt.  Pt again denies urinary sx.  Will tx with macrobid,  And given prescription for ibuprofen.  Pt encouraged recheck for worse pain,  Fevers,  Nausea or vomiting.  Also advised repeat UA after abx completed.    Culture sent        Burgess Amor, [place] 08/20/11 1055

## 2011-08-21 LAB — URINE CULTURE

## 2011-08-22 NOTE — ED Notes (Signed)
+   Urine Chart sent to EDP office for review. 

## 2012-10-20 ENCOUNTER — Encounter (HOSPITAL_COMMUNITY): Payer: Self-pay | Admitting: *Deleted

## 2012-10-20 ENCOUNTER — Emergency Department (HOSPITAL_COMMUNITY)
Admission: EM | Admit: 2012-10-20 | Discharge: 2012-10-20 | Disposition: A | Discharge status: Home / Self Care | Payer: Self-pay | Attending: Emergency Medicine | Admitting: Emergency Medicine

## 2012-10-20 DIAGNOSIS — F17200 Nicotine dependence, unspecified, uncomplicated: Secondary | ICD-10-CM | POA: Insufficient documentation

## 2012-10-20 DIAGNOSIS — M25559 Pain in unspecified hip: Secondary | ICD-10-CM | POA: Insufficient documentation

## 2012-10-20 MED ORDER — DICLOFENAC SODIUM 75 MG PO TBEC: 75.0000 mg | DELAYED_RELEASE_TABLET | Freq: Two times a day (BID) | ORAL | Status: DC

## 2012-10-20 MED ORDER — BACLOFEN 10 MG PO TABS: 10.0000 mg | ORAL_TABLET | Freq: Three times a day (TID) | ORAL | Status: AC

## 2012-10-20 NOTE — ED Notes (Signed)
R hip pain began last night.  No known injury, states "I guess it's from where I sat so long getting my hair done on Saturday."

## 2012-10-20 NOTE — ED Provider Notes (Signed)
CSN: [id number]     Arrival date & time 10/20/12  1045 History   First MD Initiated Contact with Patient 10/20/12 1132     Chief Complaint  Patient presents with  . Hip Pain   (Consider location/radiation/quality/duration/timing/severity/associated sxs/prior Treatment) Patient is a 22 y.o. female presenting with hip pain. The history is provided by the patient.  Hip Pain This is a new problem. The current episode started in the past 7 days. The problem occurs constantly. The problem has been gradually worsening. Pertinent negatives include no abdominal pain, arthralgias, chest pain, coughing, fever, neck pain, numbness or weakness. The symptoms are aggravated by standing. She has tried NSAIDs for the symptoms. The treatment provided no relief.    No past medical history on file. History reviewed. No pertinent past surgical history. History reviewed. No pertinent family history. History  Substance Use Topics  . Smoking status: Current Every Day Smoker -- 0.25 packs/day    Types: Cigarettes  . Smokeless tobacco: Not on file  . Alcohol Use: No   OB History   Grav Para Term Preterm Abortions TAB SAB Ect Mult Living                 Review of Systems  Constitutional: Negative for fever and activity change.       All ROS Neg except as noted in HPI  HENT: Negative for nosebleeds and neck pain.   Eyes: Negative for photophobia and discharge.  Respiratory: Negative for cough, shortness of breath and wheezing.   Cardiovascular: Negative for chest pain and palpitations.  Gastrointestinal: Negative for abdominal pain and blood in stool.  Genitourinary: Negative for dysuria, frequency and hematuria.  Musculoskeletal: Negative for back pain and arthralgias.  Skin: Negative.   Neurological: Negative for dizziness, seizures, speech difficulty, weakness and numbness.  Psychiatric/Behavioral: Negative for hallucinations and confusion.    Allergies  Review of patient's allergies indicates no  known allergies.  Home Medications  No current outpatient prescriptions on file. BP 124/73  Pulse 110  Temp(Src) 98.3 F (36.8 C) (Oral)  Resp 15  Ht 5\' 2"  (1.575 m)  Wt 120 lb (54.432 kg)  BMI 21.94 kg/m2  SpO2 99% Physical Exam  Nursing note and vitals reviewed. Constitutional: She is oriented to person, place, and time. She appears well-developed and well-nourished.  Non-toxic appearance.  HENT:  Head: Normocephalic.  Right Ear: Tympanic membrane and external ear normal.  Left Ear: Tympanic membrane and external ear normal.  Eyes: EOM and lids are normal. Pupils are equal, round, and reactive to light.  Neck: Normal range of motion. Neck supple. Carotid bruit is not present.  Cardiovascular: Normal rate, regular rhythm, normal heart sounds, intact distal pulses and normal pulses.   Pulmonary/Chest: Breath sounds normal. No respiratory distress.  Abdominal: Soft. Bowel sounds are normal. There is no tenderness. There is no guarding.  Musculoskeletal: Normal range of motion.  Soreness of the right lateral hip area with ROM and change of position. No dislocation. No hot joint noted. Distal pulse 2+  Lymphadenopathy:       Head (right side): No submandibular adenopathy present.       Head (left side): No submandibular adenopathy present.    She has no cervical adenopathy.  Neurological: She is alert and oriented to person, place, and time. She has normal strength. No cranial nerve deficit or sensory deficit.  Skin: Skin is warm and dry.  Psychiatric: She has a normal mood and affect. Her speech is normal.  ED Course  Procedures (including critical care time) Labs Review Labs Reviewed - No data to display Imaging Review No results found.  MDM  No diagnosis found. **I have reviewed nursing notes, vital signs, and all appropriate lab and imaging results for this patient.*  Pt noted to have musculoskeletal pain of the right hip area. Will treat with diclofenac and  robaxin. Pt to see orthopedic MD if not improving.  Kathie Dike, PA-C 10/20/12 1213

## 2012-10-20 NOTE — ED Provider Notes (Signed)
Medical screening examination/treatment/procedure(s) were performed by non-physician practitioner and as supervising physician I was immediately available for consultation/collaboration.   Shelda Jakes, MD 10/20/12 1215

## 2012-10-20 NOTE — ED Notes (Signed)
C/o right hip pain since last night, states that she may have sit too long sat. While getting her hair done, states she took 2 advil earlier today without any relief

## 2012-10-20 NOTE — ED Notes (Signed)
H. Bryant, PA at bedside. 
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
## 2014-08-04 ENCOUNTER — Emergency Department (HOSPITAL_COMMUNITY)
Admission: EM | Admit: 2014-08-04 | Discharge: 2014-08-04 | Disposition: A | Discharge status: Home / Self Care | Payer: Self-pay | Attending: Emergency Medicine | Admitting: Emergency Medicine

## 2014-08-04 ENCOUNTER — Encounter (HOSPITAL_COMMUNITY): Payer: Self-pay | Admitting: Emergency Medicine

## 2014-08-04 DIAGNOSIS — K0889 Other specified disorders of teeth and supporting structures: Secondary | ICD-10-CM

## 2014-08-04 DIAGNOSIS — Z720 Tobacco use: Secondary | ICD-10-CM | POA: Insufficient documentation

## 2014-08-04 DIAGNOSIS — K0381 Cracked tooth: Secondary | ICD-10-CM | POA: Insufficient documentation

## 2014-08-04 DIAGNOSIS — Z791 Long term (current) use of non-steroidal anti-inflammatories (NSAID): Secondary | ICD-10-CM | POA: Insufficient documentation

## 2014-08-04 DIAGNOSIS — K088 Other specified disorders of teeth and supporting structures: Secondary | ICD-10-CM | POA: Insufficient documentation

## 2014-08-04 MED ORDER — HYDROCODONE-ACETAMINOPHEN 5-325 MG PO TABS: 2.0000 | ORAL_TABLET | ORAL | Status: DC | PRN

## 2014-08-04 MED ORDER — CLINDAMYCIN HCL 300 MG PO CAPS: 150.0000 mg | ORAL_CAPSULE | Freq: Four times a day (QID) | ORAL | Status: DC

## 2014-08-04 NOTE — Discharge Instructions (Signed)
Dental Pain °A tooth ache may be caused by cavities (tooth decay). Cavities expose the nerve of the tooth to air and hot or cold temperatures. It may come from an infection or abscess (also called a boil or furuncle) around your tooth. It is also often caused by dental caries (tooth decay). This causes the pain you are having. °DIAGNOSIS  °Your caregiver can diagnose this problem by exam. °TREATMENT  °· If caused by an infection, it may be treated with medications which kill germs (antibiotics) and pain medications as prescribed by your caregiver. Take medications as directed. °· Only take over-the-counter or prescription medicines for pain, discomfort, or fever as directed by your caregiver. °· Whether the tooth ache today is caused by infection or dental disease, you should see your dentist as soon as possible for further care. °SEEK MEDICAL CARE IF: °The exam and treatment you received today has been provided on an emergency basis only. This is not a substitute for complete medical or dental care. If your problem worsens or new problems (symptoms) appear, and you are unable to meet with your dentist, call or return to this location. °SEEK IMMEDIATE MEDICAL CARE IF:  °· You have a fever. °· You develop redness and swelling of your face, jaw, or neck. °· You are unable to open your mouth. °· You have severe pain uncontrolled by pain medicine. °MAKE SURE YOU:  °· Understand these instructions. °· Will watch your condition. °· Will get help right away if you are not doing well or get worse. °Document Released: 01/15/2005 Document Revised: 04/09/2011 Document Reviewed: 09/03/2007 °ExitCare® Patient Information ©2015 [employer]. This information is not intended to replace advice given to you by your health care provider. Make sure you discuss any questions you have with your health care provider. ° °Emergency Department Resource Guide °1) Find a Doctor and Pay Out of Pocket °Although you won't have to find out who  is covered by your insurance plan, it is a good idea to ask around and get recommendations. You will then need to call the office and see if the doctor you have chosen will accept you as a new patient and what types of options they offer for patients who are self-pay. Some doctors offer discounts or will set up payment plans for their patients who do not have insurance, but you will need to ask so you aren't surprised when you get to your appointment. ° °2) Contact Your Local Health Department °Not all health departments have doctors that can see patients for sick visits, but many do, so it is worth a call to see if yours does. If you don't know where your local health department is, you can check in your phone book. The CDC also has a tool to help you locate your state's health department, and many state websites also have listings of all of their local health departments. ° °3) Find a Walk-in Clinic °If your illness is not likely to be very severe or complicated, you may want to try a walk in clinic. These are popping up all over the country in pharmacies, drugstores, and shopping centers. They're usually staffed by nurse practitioners or physician assistants that have been trained to treat common illnesses and complaints. They're usually fairly quick and inexpensive. However, if you have serious medical issues or chronic medical problems, these are probably not your best option. ° °No Primary Care Doctor: °- Call [facility] at  832-8000 - they can help you locate a primary   care doctor that  accepts your insurance, provides certain services, etc. °- Physician Referral Service- 1-800-533-3463 ° °Chronic Pain Problems: °Organization         Address  Phone   Notes  °[facility]  (336) 297-2271 Patients need to be referred by their primary care doctor.  ° °Medication Assistance: °Organization         Address  Phone   Notes  °[place] [facility] [address]  [address] °[place], [place] [address] (336) 641-8030 --Must be a resident of [place] °-- Must have NO insurance coverage whatsoever (no Medicaid/ Medicare, etc.) °-- The pt. MUST have a primary care doctor that directs their care regularly and follows them in the community °  °[facility]  (866) 331-1348   °[employer]  (888) 892-1162   ° °Agencies that provide inexpensive medical care: °Organization         Address  Phone   Notes  °[facility]  (336) 832-8035   °[facility]    (336) 832-7272   °[facility] [address] °[place], [place] [address] (336) 832-4777   °[facility] [address], °[place] (336) 271-4999   °[facility]    (336) 373-0678   °[facility]    (336) 272-1050   °[facility] ° [address], [place] Phone:  (336) 832-4444, Fax:  (336) 832-4440 Hours of Operation:  9 am - 6 pm, M-F.  Also accepts Medicaid/Medicare and self-pay.  °[facility] ° [address], [place] Phone: (336) 832-3150, Fax: (336) 832-3151. Hours of Operation:  8:30 am - 5:30 pm, M-F.  Also accepts Medicaid and self-pay.  °[facility] [place] [address], [place] Phone: (336) 878-6027   °[facility] [address], [place], [place] (336)723-1848, Ext. 123 Mondays & Thursdays: 7-9 AM.  First 15 patients are seen on a first come, first serve basis. °  ° °Medicaid-accepting [place] Providers: ° °Organization         Address  Phone   Notes  °[facility] [address], [place] (336) 641-2100 Also accepts self-pay patients.  °[facility] [address], [place] ° (336) 856-9996   °[facility] [address], [place] (336) 288-8857   °[facility] [address] [place] [address], [place] (336) 299-7000   °Veita Bland [address], [place]  ° (336) 373-1557 Only accepts [place] Access Medicaid patients after they have their name applied to their card.  ° °Self-Pay (no insurance) in [place]: ° °Organization         Address  Phone   Notes  °Sickle Cell Patients, [facility] [address], [place] (336) 832-1970   °[facility] [address], [place] (336) 832-4400   °[facility] ° [address], [place] (336) 992-4800   °[facility]/Dr. Osei-Bonsu ° [address] [place] [address], [place] or [address], [place] (336) 841-8500 Phone number for both [place] and [place] locations is the same.  °[facility] [address], [place] (336) 299-0000   °[facility] [address] [place] [address],  or [address] (336) 852-7530 °(336) 878-2260   °  [facility] [address], [place] (336) 350-1642, phone; (336) 294-5005, fax Sees patients 1st and 3rd Saturday of every month.  Must not qualify for public or private insurance (i.e. Medicaid, Medicare, [place] Health Choice, Veterans' Benefits) • Household income should be no more than 200% of the poverty level •The clinic cannot treat you if you are pregnant or think you are pregnant • Sexually transmitted diseases are not treated at the clinic.  ° ° °Dental Care: °Organization         Address  Phone  Notes  °[facility] [facility] [address], [place] (336) 641-6152 Accepts children up to age 21 who are enrolled in Medicaid or [place] Health Choice; pregnant women with a Medicaid card; and children who have applied for Medicaid or [place] Health Choice, but were declined, whose parents can pay a reduced fee at time of service.  °[facility] [place]  [address], [place] (336) 641-7733 Accepts children up to age 21 who are  enrolled in Medicaid or [place] Health Choice; pregnant women with a Medicaid card; and children who have applied for Medicaid or [place] Health Choice, but were declined, whose parents can pay a reduced fee at time of service.  °[facility] ° [address], [place] (336) 641-4533 Patients are seen by appointment only. Walk-ins are not accepted. [facility] will see patients 18 years of age and older. °Monday - Tuesday (8am-5pm) °Most Wednesdays (8:30-5pm) °$30 per visit, cash only  °[facility] ° [address], [place] (336) 641-4533 Patients are seen by appointment only. Walk-ins are not accepted. [facility] will see patients 18 years of age and older. °One Wednesday Evening (Monthly: Volunteer Based).  $30 per visit, cash only  °[facility]  (919) 537-3737 for adults; Children under age 4, call Graduate Pediatric Dentistry at ([phone]) [phone]. Children aged 4-14, please call (919) [phone] to request a pediatric application. ° Dental services are provided in all areas of dental care including fillings, crowns and bridges, complete and partial dentures, implants, gum treatment, root canals, and extractions. Preventive care is also provided. Treatment is provided to both adults and children. °Patients are selected via a lottery and there is often a waiting list. °  °[facility] [address], °[place] ° (336) 763-8833 [email or web address] °  °[facility] [address], [place], [place] (336)723-1848, Ext. 123 Second and Fourth Thursday of each month, opens at 6:30 AM; Clinic ends at 9 AM.  Patients are seen on a first-come first-served basis, and a limited number are seen during each clinic.  ° °[facility] ° [address], [place], [place] (336) 723-7904   Eligibility Requirements °You must have lived in [place], [place], or [place] counties for at least the last three months. °  You  cannot be eligible for state or federal sponsored healthcare insurance, including Veterans Administration, Medicaid, or Medicare. °  You generally cannot be eligible for healthcare insurance through your employer.  °  How to apply: °Eligibility screenings are held every Tuesday and Wednesday afternoon from 1:00 pm until 4:00 pm. You do not need an appointment for the interview!  °[facility] [address], [place],  336-631-2330   °[place] [facility]  336-342-8273   °[facility]  336-703-3100   °[facility]  336-570-6415   ° °Behavioral Health Resources in the Community: °Intensive Outpatient Programs °Organization         Address  Phone  Notes  °[facility] [address], [place], [place] 336-878-6098   °[facility] [address], [place], [place] 336-832-9800   °[facility] [address], [place], [place] ° 336-882-2125   °[facility] [address],  °[place], [place] 1-800-853-5163 or 336-641-4981   °Substance Abuse Resources °Organization         Address  Phone  Notes  °[facility]  336-882-2125   °[facility]  336-784-9470   °[facility]  336-285-9073   °[facility]  336-845-3988   °Residential & Outpatient Substance Abuse Program  1-800-659-3381   °Psychological Services °Organization         Address  Phone  Notes  °[facility]  336- 832-9600   °[facility]  336- 378-7881   °[facility] [address], [place] 1-800-853-5163 or 336-641-4981   ° °Mobile Crisis Teams °Organization         Address  Phone  Notes  °[facility], Mobile Crisis Care Unit  1-877-626-1772   °Assertive °[facility] ° [address] [place], [place] 336-834-9664   °Sharon DeEsch [address] °[place] 336-554-5454   ° °Self-Help/Support  Groups °Organization         Address  Phone             Notes  °[facility] [place] - variety of support groups  336- 373-1402 Call for more information  °Narcotics Anonymous (NA), [facility] [address], °[place]  2 meetings at this location  ° °Residential Treatment Programs °Organization         Address  Phone  Notes  °[facility] [address],    °[place]  1-866-801-8205   °[facility] ° [address], [place], [place] 704-293-8524   °[facility] [address], [place] 336-845-3988 Admissions: 8am-3pm M-F  °[facility] [address]    °[place], [place] 336-841-1104   °[facility] [address], [place], [place] 336-379-7146   °[facility] [address]  °[place], [place] 336-285-9073   °[facility] Intensive Outpatient [address], [place], [place] 336-852-3033   °[facility] [address]  °[place], [place] 1-877-615-2722 or 336-784-9470   °[facility] [address] [place], [place] 336-227-7417 Accepts Medicaid  °[facility] [address]  °[place] 1-800-659-3381 Substance Abuse/Addiction Treatment  ° °[place] Behavioral Health Resources °Organization         Address  Phone  Notes  °[facility]  (888) 581-9988   °Julie Brannon, PhD [address] [place], [place]   (336) 349-5553 or (336) 951-0000   °[facility]   [address] °[place], [place] (336) 349-4454   °[facility] [address], [place], [place] (336) 342-8316 Insurance/Medicaid/sponsorship through [facility]  °[facility] [address]                                    [place], [place] (336) 342-8316 Therapy/tele-psych/case  °[facility]   [address]  ° [place], [place] (336) 349-2233    °Dr. Arfeen  (336) 349-4544   °[facility] [place]  [facility]  [employer] [place] [facility] 1) [address], [place] °2) [address], [place] °3)  [address], [place] (336) 349-3220 °(336) 342-7768 ° °(336) 342-8140   °[place] Child Abuse Hot[facility] (336) 342-1394 or (336) 342-3537 (After Hours)    ° ° ° °
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
## 2014-08-04 NOTE — ED Provider Notes (Signed)
CSN: [id number]     Arrival date & time 08/04/14  1316 History   First MD Initiated Contact with Patient 08/04/14 1409     Chief Complaint  Patient presents with  . Dental Pain     (Consider location/radiation/quality/duration/timing/severity/associated sxs/prior Treatment) Patient is a 24 y.o. female presenting with tooth pain. The history is provided by the patient. No language interpreter was used.  Dental Pain Location:  Lower Lower teeth location:  30/RL 1st molar Quality:  No pain Severity:  Moderate Onset quality:  Gradual Timing:  Constant Progression:  Worsening Chronicity:  New Previous work-up:  Dental exam Relieved by:  Nothing Worsened by:  Nothing tried Ineffective treatments:  None tried Associated symptoms: gum swelling   Associated symptoms: no congestion     History reviewed. No pertinent past medical history. History reviewed. No pertinent past surgical history. No family history on file. History  Substance Use Topics  . Smoking status: Current Every Day Smoker -- 0.25 packs/day    Types: Cigarettes  . Smokeless tobacco: Not on file  . Alcohol Use: No   OB History    No data available     Review of Systems  HENT: Negative for congestion.   All other systems reviewed and are negative.     Allergies  Review of patient's allergies indicates no known allergies.  Home Medications   Prior to Admission medications   Medication Sig Start Date End Date Taking? Authorizing Provider  clindamycin (CLEOCIN) 300 MG capsule Take 1 capsule (300 mg total) by mouth every 6 (six) hours. 08/04/14   Elson AreasLeslie K Humaira Sculley, PA-C  diclofenac (VOLTAREN) 75 MG EC tablet Take 1 tablet (75 mg total) by mouth 2 (two) times daily. 10/20/12   Ivery QualeHobson Bryant, PA-C  HYDROcodone-acetaminophen (NORCO/VICODIN) 5-325 MG per tablet Take 2 tablets by mouth every 4 (four) hours as needed. 08/04/14   Elson AreasLeslie K Shamar Engelmann, PA-C   BP 126/67 mmHg  Pulse 72  Temp(Src) 98.9 F (37.2 C) (Oral)  Resp  18  Ht 5\' 2"  (1.575 m)  Wt 125 lb (56.7 kg)  BMI 22.86 kg/m2  SpO2 100% Physical Exam  Constitutional: She appears well-developed and well-nourished.  HENT:  Head: Normocephalic and atraumatic.  Broken right lower 1st molar,  Swelling around gum,  Possible early infecion  Musculoskeletal: Normal range of motion.  Neurological: She is alert.  Skin: Skin is warm.  Psychiatric: She has a normal mood and affect.  Nursing note and vitals reviewed.   ED Course  Procedures (including critical care time) Labs Review Labs Reviewed - No data to display  Imaging Review No results found.   EKG Interpretation None      MDM  Pt advised to follow up with a dentist for treatment Clindamycin Hydrocodone Resource guide   Final diagnoses:  Toothache    avs    Elson AreasLeslie K Darrin Apodaca, PA-C 08/04/14 1424  Gilda Creasehristopher J Pollina, MD 08/04/14 1501

## 2015-01-25 ENCOUNTER — Encounter (HOSPITAL_COMMUNITY): Payer: Self-pay | Admitting: *Deleted

## 2015-01-25 ENCOUNTER — Emergency Department (HOSPITAL_COMMUNITY)
Admission: EM | Admit: 2015-01-25 | Discharge: 2015-01-25 | Disposition: A | Discharge status: Home / Self Care | Payer: Self-pay | Attending: Emergency Medicine | Admitting: Emergency Medicine

## 2015-01-25 DIAGNOSIS — Z791 Long term (current) use of non-steroidal anti-inflammatories (NSAID): Secondary | ICD-10-CM | POA: Insufficient documentation

## 2015-01-25 DIAGNOSIS — F17210 Nicotine dependence, cigarettes, uncomplicated: Secondary | ICD-10-CM | POA: Insufficient documentation

## 2015-01-25 DIAGNOSIS — L03116 Cellulitis of left lower limb: Secondary | ICD-10-CM | POA: Insufficient documentation

## 2015-01-25 MED ORDER — SULFAMETHOXAZOLE-TRIMETHOPRIM 800-160 MG PO TABS: 1.0000 | ORAL_TABLET | Freq: Two times a day (BID) | ORAL | Status: AC

## 2015-01-25 MED ORDER — SULFAMETHOXAZOLE-TRIMETHOPRIM 800-160 MG PO TABS
1.0000 | ORAL_TABLET | Freq: Once | ORAL | Status: AC
  Administered 2015-01-25: 1 via ORAL
  Filled 2015-01-25: qty 1

## 2015-01-25 MED ORDER — IBUPROFEN 800 MG PO TABS
800.0000 mg | ORAL_TABLET | Freq: Once | ORAL | Status: AC
  Administered 2015-01-25: 800 mg via ORAL
  Filled 2015-01-25: qty 1

## 2015-01-25 MED ORDER — IBUPROFEN 600 MG PO TABS: 600.0000 mg | ORAL_TABLET | Freq: Four times a day (QID) | ORAL | Status: DC | PRN

## 2015-01-25 NOTE — Discharge Instructions (Signed)

## 2015-01-25 NOTE — ED Notes (Addendum)
Pt reports "hard spot" on left lower leg, noticed it this morning. States she "popped it" today and it bleed only, no pus. Area is red. Pt. Thinks it is a bug bite.

## 2015-01-27 NOTE — ED Provider Notes (Signed)
CSN: [id number]     Arrival date & time 01/25/15  1730 History   First MD Initiated Contact with Patient 01/25/15 1803     Chief Complaint  Patient presents with  . Insect Bite     (Consider location/radiation/quality/duration/timing/severity/associated sxs/prior Treatment) The history is provided by the patient.   Marissa Boyle is a 24 y.o. female presenting with pain, swelling and redness along her medial left lower leg which she first noticed this morning.  She guessed this was possibly a bug bite, although did not see an insect.  She "popped" the central raised pimple and a small amount of blood drained, no purulence.  Since then the redness surrounding the site has spread and become more tender.  She denies fevers or chills, nausea, abdominal pain.  She is taking no medications or treatments for this problem.    History reviewed. No pertinent past medical history. History reviewed. No pertinent past surgical history. History reviewed. No pertinent family history. Social History  Substance Use Topics  . Smoking status: Current Every Day Smoker -- 0.25 packs/day    Types: Cigarettes  . Smokeless tobacco: None  . Alcohol Use: No   OB History    No data available     Review of Systems  Constitutional: Negative for fever and chills.  Respiratory: Negative for shortness of breath and wheezing.   Skin: Positive for color change and wound.  Neurological: Negative for numbness.      Allergies  Review of patient's allergies indicates no known allergies.  Home Medications   Prior to Admission medications   Medication Sig Start Date End Date Taking? Authorizing Provider  clindamycin (CLEOCIN) 300 MG capsule Take 1 capsule (300 mg total) by mouth every 6 (six) hours. 08/04/14   Elson Areas, PA-C  diclofenac (VOLTAREN) 75 MG EC tablet Take 1 tablet (75 mg total) by mouth 2 (two) times daily. 10/20/12   Ivery Quale, PA-C  HYDROcodone-acetaminophen (NORCO/VICODIN) 5-325 MG  per tablet Take 2 tablets by mouth every 4 (four) hours as needed. 08/04/14   Elson Areas, PA-C  ibuprofen (ADVIL,MOTRIN) 600 MG tablet Take 1 tablet (600 mg total) by mouth every 6 (six) hours as needed. 01/25/15   Burgess Amor, PA-C  sulfamethoxazole-trimethoprim (BACTRIM DS,SEPTRA DS) 800-160 MG tablet Take 1 tablet by mouth 2 (two) times daily. 01/25/15 02/01/15  Raynelle Fanning Jhamir Pickup, PA-C   BP 120/75 mmHg  Pulse 86  Temp(Src) 98.4 F (36.9 C) (Oral)  Resp 16  Ht  (1.575 m)  Wt 58.06 kg  BMI 23.41 kg/m2  SpO2 98% Physical Exam  Constitutional: She appears well-developed and well-nourished. No distress.  HENT:  Head: Normocephalic.  Neck: Neck supple.  Cardiovascular: Normal rate.   Pulmonary/Chest: Effort normal. She has no wheezes.  Musculoskeletal: Normal range of motion. She exhibits no edema.  Skin:  Tiny papule with central punctum left medial mid calf.  1 cm area of induration surrounded by 4 cm area of erythema.  There is no red streaking, there is no drainage, no fluctuance.    ED Course  Procedures (including critical care time) Labs Review Labs Reviewed - No data to display  Imaging Review No results found. I have personally reviewed and evaluated these images and lab results as part of my medical decision-making.   EKG Interpretation None      MDM   Final diagnoses:  Cellulitis of left lower extremity    Suspect early cellulitis, although this could be an inflammatory reaction  to an insect bite.  She was encouraged warm compresses, prescribed ibuprofen for pain and swelling, Bactrim twice a day.  Advised recheck here for any worsening symptoms or if symptoms do not respond to treatment over the next 48 hours.    Burgess AmorJulie Sholanda Croson, PA-C 01/27/15 0205  Rolland PorterMark James, MD 02/05/15 [phone]

## 2022-12-09 DIAGNOSIS — F1110 Opioid abuse, uncomplicated: Secondary | ICD-10-CM | POA: Diagnosis not present

## 2023-01-03 ENCOUNTER — Other Ambulatory Visit: Payer: Self-pay

## 2023-01-03 ENCOUNTER — Encounter (HOSPITAL_COMMUNITY): Payer: Self-pay | Admitting: Family Medicine

## 2023-01-03 ENCOUNTER — Ambulatory Visit (HOSPITAL_COMMUNITY)
Admission: EM | Admit: 2023-01-03 | Discharge: 2023-01-03 | Disposition: A | Discharge status: Home / Self Care | Payer: 59 | Attending: Family Medicine | Admitting: Family Medicine

## 2023-01-03 DIAGNOSIS — R030 Elevated blood-pressure reading, without diagnosis of hypertension: Secondary | ICD-10-CM | POA: Insufficient documentation

## 2023-01-03 DIAGNOSIS — R112 Nausea with vomiting, unspecified: Secondary | ICD-10-CM | POA: Insufficient documentation

## 2023-01-03 DIAGNOSIS — M7918 Myalgia, other site: Secondary | ICD-10-CM | POA: Insufficient documentation

## 2023-01-03 LAB — COMPREHENSIVE METABOLIC PANEL
ALT: 19 U/L (ref 0–44)
AST: 19 U/L (ref 15–41)
Albumin: 4.6 g/dL (ref 3.5–5.0)
Alkaline Phosphatase: 64 U/L (ref 38–126)
Anion gap: 14 (ref 5–15)
BUN: 8 mg/dL (ref 6–20)
CO2: 25 mmol/L (ref 22–32)
Calcium: 10.2 mg/dL (ref 8.9–10.3)
Chloride: 98 mmol/L (ref 98–111)
Creatinine, Ser: 0.78 mg/dL (ref 0.44–1.00)
GFR, Estimated: >60 mL/min (ref >60)
Glucose, Bld: 106 mg/dL — ABNORMAL HIGH (ref 70–99)
Potassium: 2.8 mmol/L — ABNORMAL LOW (ref 3.5–5.1)
Sodium: 137 mmol/L (ref 135–145)
Total Bilirubin: 1.7 mg/dL — ABNORMAL HIGH (ref <1.2)
Total Protein: 7.9 g/dL (ref 6.5–8.1)

## 2023-01-03 LAB — HCG, QUANTITATIVE, PREGNANCY: hCG, Beta Chain, Quant, S: 1 m[IU]/mL (ref <5)

## 2023-01-03 LAB — LIPASE, BLOOD: Lipase: 33 U/L (ref 11–51)

## 2023-01-03 MED ORDER — LIDOCAINE HCL (PF) 1 % IJ SOLN
5.0000 mL | Freq: Once | INTRAMUSCULAR | Status: AC
  Administered 2023-01-03: 5 mL

## 2023-01-03 MED ORDER — LIDOCAINE HCL (PF) 1 % IJ SOLN
INTRAMUSCULAR | Status: AC
  Filled 2023-01-03: qty 30

## 2023-01-03 MED ORDER — PENTAFLUOROPROP-TETRAFLUOROETH EX AERO
INHALATION_SPRAY | CUTANEOUS | Status: AC
  Filled 2023-01-03: qty 30

## 2023-01-03 NOTE — ED Triage Notes (Signed)
Vomiting since yesterday morning.  LMP November 4-5 "something like that" For a month.  Patient has had a sharp pain  in left upper back, medial to left shoulder blade.  Patient is right handed.  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  Pain does not go away per patient  Patient took left over muscle relaxers.

## 2023-01-03 NOTE — ED Triage Notes (Signed)
If patient stretches arms over her head, pain improves briefly.  Has been "getting her sister to pop her back"

## 2023-01-03 NOTE — Discharge Instructions (Addendum)
You may be dealing with cyclic vomiting syndrome. Avoid alcohol, marijuana, and foods that exacerbate your symptoms. If you see resolution of your symptoms it is likely 1 of these offending agents. We will follow-up with your laboratory work to check for pancreas, liver, or pregnancy causes. Start gentle upper back stretches for your muscle strain. You can place lidocaine patches for pain control or Tylenol orally as well. You need to establish with a PCP in order to monitor and manage your high blood pressure. If this goes untreated it can do damage to your eyes, brain, heart, kidneys, and other structures in your body.

## 2023-01-03 NOTE — ED Provider Notes (Signed)
[facility]    CSN: [id number] Arrival date & time: 01/03/23  0803      History   Chief Complaint Chief Complaint  Patient presents with   Emesis   Back Pain    HPI Marissa Boyle is a 32 y.o. female.   The patient is a 32 year old female presenting with upper back pain next to the scapula that [place].  She has had some ongoing aching/cramping in the area that is exacerbated with movement.  She denies any chest pain, palpitations, trouble breathing, coughing, or recent trauma. She also has some nausea and vomiting since yesterday with food intake.  This is similar to the nausea and vomiting she experiences with her monthly menstrual cycle but worse this time.  She has not eaten any new or suspicious foods, but has had this happen before.  She notes that she does drink alcohol periodically and smokes marijuana several times a week.  She does think there was some decrease in symptoms after cutting these out previously.  She has been sexually active recently using condoms for protection but does not believe she is pregnant.  She denies any yellowing of the skin or mucosa, fevers, chills, blood or coffee grounds in her vomit, abdominal pain, flank pain, changes in bowel or bladder habits. She has been checking her blood pressure at home which has been high in the 150s-160s systolic but she has not had any headaches, dizziness, syncope, chest pain, palpitations, or difficulty breathing.  She is setting up with a PCP soon now that she is moved into town.  The history is provided by the patient.    History reviewed. No pertinent past medical history.  There are no problems to display for this patient.   Past Surgical History:  Procedure Laterality Date   CHOLECYSTECTOMY      OB History   No obstetric history on file.      Home Medications    Prior to Admission medications   Medication Sig Start Date End Date Taking?  Authorizing Provider  zolpidem (AMBIEN) 5 MG tablet Take 5 mg by mouth at bedtime as needed for sleep.   Yes [provider]    Family History History reviewed. No pertinent family history.  Social History Social History   Tobacco Use   Smoking status: Every Day    Types: Cigarettes   Smokeless tobacco: Never  Vaping Use   Vaping status: Never Used  Substance Use Topics   Alcohol use: Yes   Drug use: Yes    Types: Marijuana     Allergies   Patient has no known allergies.   Review of Systems Review of Systems  Constitutional:  Negative for activity change, appetite change, chills, fatigue, fever and unexpected weight change.  Respiratory:  Negative for chest tightness and shortness of breath.   Cardiovascular:  Negative for chest pain and palpitations.  Gastrointestinal:  Positive for nausea and vomiting. Negative for abdominal distention, abdominal pain, blood in stool, constipation and diarrhea.  Genitourinary:  Negative for difficulty urinating, dysuria, flank pain, hematuria, pelvic pain and vaginal discharge.  Musculoskeletal:  Negative for arthralgias and myalgias.  Skin:  Negative for color change and rash.  Neurological:  Positive for light-headedness (Intermittently). Negative for dizziness, syncope, weakness and headaches.  Psychiatric/Behavioral:  Negative for confusion.      Physical Exam Triage Vital Signs ED Triage Vitals  Encounter Vitals Group     BP  Systolic BP Percentile      Diastolic BP Percentile      Pulse      Resp      Temp      Temp src      SpO2      Weight      Height      Head Circumference      Peak Flow      Pain Score      Pain Loc      Pain Education      Exclude from Growth Chart    No data found.  Updated Vital Signs BP (!) 158/111 (BP Location: Right Arm)   Pulse (!) 125   Temp 98.8 F (37.1 C) (Oral)   Resp 18   LMP 12/03/2022   SpO2 96%   Visual Acuity Right Eye Distance:   Left Eye Distance:    Bilateral Distance:    Right Eye Near:   Left Eye Near:    Bilateral Near:     Physical Exam Vitals reviewed.  Constitutional:      Appearance: Normal appearance. She is normal weight.  HENT:     Head: Normocephalic and atraumatic.     Mouth/Throat:     Mouth: Mucous membranes are moist.  Eyes:     General: No scleral icterus.    Extraocular Movements: Extraocular movements intact.     Conjunctiva/sclera: Conjunctivae normal.     Pupils: Pupils are equal, round, and reactive to light.  Neck:     Comments: No JVD Cardiovascular:     Rate and Rhythm: Normal rate and regular rhythm.     Pulses: Normal pulses.     Heart sounds: No murmur heard. Pulmonary:     Effort: Pulmonary effort is normal.     Breath sounds: Normal breath sounds.  Abdominal:     General: Abdomen is flat. Bowel sounds are normal. There is no distension.     Palpations: Abdomen is soft. There is no mass.     Tenderness: There is no abdominal tenderness. There is no right CVA tenderness, left CVA tenderness, guarding or rebound.     Hernia: No hernia is present.  Musculoskeletal:     Cervical back: Normal range of motion.  Skin:    General: Skin is warm and dry.     Capillary Refill: Capillary refill takes 2 to 3 seconds.     Coloration: Skin is not jaundiced or pale.     Findings: No erythema or rash.  Neurological:     General: No focal deficit present.     Mental Status: She is alert.  Psychiatric:        Mood and Affect: Mood normal.        Behavior: Behavior normal.      UC Treatments / Results  Labs (all labs ordered are listed, but only abnormal results are displayed) Labs Reviewed  LIPASE, BLOOD  HCG, QUANTITATIVE, PREGNANCY  COMPREHENSIVE METABOLIC PANEL    EKG   Radiology No results found.  Procedures Procedures (including critical care time)  Medications Ordered in UC Medications  lidocaine (PF) (XYLOCAINE) 1 % injection 5 mL (5 mLs Other Given 01/03/23 0919)     Initial Impression / Assessment and Plan / UC Course  I have reviewed the triage vital signs and the nursing notes.  Pertinent labs & imaging results that were available during my care of the patient were reviewed by me and considered in my medical decision making (see  chart for details).     Myofascial pain of the left rhomboid - this is likely secondary to heavy lifting that occurred just prior to her pain onset -Trigger point injection performed today with 5 cc of lidocaine.  This provided some relief. - No concern for fracture or other injury.  No imaging indicated - We discussed gentle stretching at home with range of motion exercises. - She can take Tylenol/ibuprofen for pain as well as needed.  Nausea and vomiting, mild, stable -This is likely cyclic vomiting syndrome versus acute gastroenteritis versus pancreatitis - Patient does have a history of recurrent vomiting symptoms that follows use of marijuana.  She reports her nausea and vomiting is similar to menstrual symptoms but worse. - Patient reports she does not respond to Zofran. - This is controlled in clinic today. - We discussed eliminating alcohol, marijuana, fatty or fried foods to see if her symptoms resolved. - No other red flag symptoms. - Pregnancy, lipase, and CMP pending - The patient's symptoms were tolerable at time of discharge without medication. - I recommended establishing and follow-up with PCP - The patient voiced understanding and agreement with the plan.  Elevated blood pressure reading -The patient's blood pressure was elevated in the 150s over low 100s on repeat, but asymptomatic - She reports her blood pressure has been elevated at home as well. - She plans to set up with a PCP now that she has moved into town. - I discussed the reasons for wanting to monitor and manage blood pressure and the risks of uncontrolled blood pressure on the body. - The patient agreed to follow-up.  Final Clinical  Impressions(s) / UC Diagnoses   Final diagnoses:  Muscle pain, myofascial  Nausea and vomiting, unspecified vomiting type  Elevated blood pressure reading     Discharge Instructions      You may be dealing with cyclic vomiting syndrome. Avoid alcohol, marijuana, and foods that exacerbate your symptoms. If you see resolution of your symptoms it is likely 1 of these offending agents. We will follow-up with your laboratory work to check for pancreas, liver, or pregnancy causes. Start gentle upper back stretches for your muscle strain. You can place lidocaine patches for pain control or Tylenol orally as well. You need to establish with a PCP in order to monitor and manage your high blood pressure. If this goes untreated it can do damage to your eyes, brain, heart, kidneys, and other structures in your body.     ED Prescriptions   None    PDMP not reviewed this encounter.   Ivor Messier, MD 01/03/23 1110

## 2023-01-04 ENCOUNTER — Telehealth (HOSPITAL_COMMUNITY): Payer: Self-pay

## 2023-01-04 DIAGNOSIS — Z01419 Encounter for gynecological examination (general) (routine) without abnormal findings: Secondary | ICD-10-CM | POA: Diagnosis not present

## 2023-01-04 DIAGNOSIS — N898 Other specified noninflammatory disorders of vagina: Secondary | ICD-10-CM | POA: Diagnosis not present

## 2023-01-04 DIAGNOSIS — Z1151 Encounter for screening for human papillomavirus (HPV): Secondary | ICD-10-CM | POA: Diagnosis not present

## 2023-01-04 DIAGNOSIS — N921 Excessive and frequent menstruation with irregular cycle: Secondary | ICD-10-CM | POA: Diagnosis not present

## 2023-01-04 DIAGNOSIS — Z113 Encounter for screening for infections with a predominantly sexual mode of transmission: Secondary | ICD-10-CM | POA: Diagnosis not present

## 2023-01-04 DIAGNOSIS — Z124 Encounter for screening for malignant neoplasm of cervix: Secondary | ICD-10-CM | POA: Diagnosis not present

## 2023-01-04 DIAGNOSIS — Z8742 Personal history of other diseases of the female genital tract: Secondary | ICD-10-CM | POA: Diagnosis not present

## 2023-01-04 MED ORDER — POTASSIUM CHLORIDE CRYS ER 20 MEQ PO TBCR: 20.0000 meq | EXTENDED_RELEASE_TABLET | Freq: Every day | ORAL | 0 refills | Status: DC

## 2023-01-04 NOTE — Telephone Encounter (Signed)
Per Helaine Chess,  PA-C, "Please send rx for Klor-con 20 meq every day x 3 days." Attempted to reach patient x1. LVM.

## 2023-01-09 DIAGNOSIS — R112 Nausea with vomiting, unspecified: Secondary | ICD-10-CM | POA: Diagnosis not present

## 2023-01-09 DIAGNOSIS — R030 Elevated blood-pressure reading, without diagnosis of hypertension: Secondary | ICD-10-CM | POA: Diagnosis not present

## 2023-01-09 DIAGNOSIS — R102 Pelvic and perineal pain: Secondary | ICD-10-CM | POA: Diagnosis not present

## 2023-01-09 DIAGNOSIS — N83201 Unspecified ovarian cyst, right side: Secondary | ICD-10-CM | POA: Diagnosis not present

## 2023-01-09 DIAGNOSIS — N921 Excessive and frequent menstruation with irregular cycle: Secondary | ICD-10-CM | POA: Diagnosis not present

## 2023-03-13 ENCOUNTER — Encounter (HOSPITAL_COMMUNITY): Admission: AD | Disposition: A | Discharge status: Home / Self Care | Payer: Self-pay | Source: Home / Self Care

## 2023-03-13 ENCOUNTER — Inpatient Hospital Stay (HOSPITAL_COMMUNITY)
Admission: AD | Admit: 2023-03-13 | Discharge: 2023-03-13 | Disposition: A | Discharge status: Home / Self Care | Payer: Managed Care, Other (non HMO) | Attending: Obstetrics and Gynecology | Admitting: Obstetrics and Gynecology

## 2023-03-13 ENCOUNTER — Inpatient Hospital Stay (HOSPITAL_COMMUNITY): Payer: Managed Care, Other (non HMO)

## 2023-03-13 ENCOUNTER — Inpatient Hospital Stay (EMERGENCY_DEPARTMENT_HOSPITAL): Payer: Managed Care, Other (non HMO) | Admitting: Registered Nurse

## 2023-03-13 ENCOUNTER — Other Ambulatory Visit: Payer: Self-pay

## 2023-03-13 ENCOUNTER — Inpatient Hospital Stay (HOSPITAL_COMMUNITY): Payer: Managed Care, Other (non HMO) | Admitting: Registered Nurse

## 2023-03-13 ENCOUNTER — Encounter (HOSPITAL_COMMUNITY): Payer: Self-pay | Admitting: Emergency Medicine

## 2023-03-13 DIAGNOSIS — O26891 Other specified pregnancy related conditions, first trimester: Secondary | ICD-10-CM | POA: Diagnosis present

## 2023-03-13 DIAGNOSIS — Z3A00 Weeks of gestation of pregnancy not specified: Secondary | ICD-10-CM

## 2023-03-13 DIAGNOSIS — O99321 Drug use complicating pregnancy, first trimester: Secondary | ICD-10-CM | POA: Diagnosis not present

## 2023-03-13 DIAGNOSIS — O99331 Smoking (tobacco) complicating pregnancy, first trimester: Secondary | ICD-10-CM | POA: Diagnosis not present

## 2023-03-13 DIAGNOSIS — Z3A01 Less than 8 weeks gestation of pregnancy: Secondary | ICD-10-CM

## 2023-03-13 DIAGNOSIS — R102 Pelvic and perineal pain: Secondary | ICD-10-CM

## 2023-03-13 DIAGNOSIS — O00102 Left tubal pregnancy without intrauterine pregnancy: Secondary | ICD-10-CM | POA: Diagnosis not present

## 2023-03-13 DIAGNOSIS — F1290 Cannabis use, unspecified, uncomplicated: Secondary | ICD-10-CM | POA: Diagnosis not present

## 2023-03-13 DIAGNOSIS — F17210 Nicotine dependence, cigarettes, uncomplicated: Secondary | ICD-10-CM | POA: Diagnosis not present

## 2023-03-13 DIAGNOSIS — O009 Unspecified ectopic pregnancy without intrauterine pregnancy: Secondary | ICD-10-CM

## 2023-03-13 DIAGNOSIS — K661 Hemoperitoneum: Secondary | ICD-10-CM

## 2023-03-13 HISTORY — PX: LAPAROSCOPIC BILATERAL SALPINGECTOMY: SHX5889

## 2023-03-13 HISTORY — PX: DIAGNOSTIC LAPAROSCOPY WITH REMOVAL OF ECTOPIC PREGNANCY: SHX6449

## 2023-03-13 LAB — URINALYSIS, ROUTINE W REFLEX MICROSCOPIC
Bilirubin Urine: NEGATIVE
Glucose, UA: NEGATIVE mg/dL
Ketones, ur: 5 mg/dL — AB
Leukocytes,Ua: NEGATIVE
Nitrite: NEGATIVE
Protein, ur: 30 mg/dL — AB
Specific Gravity, Urine: 1.023 (ref 1.005–1.030)
pH: 6 (ref 5.0–8.0)

## 2023-03-13 LAB — COMPREHENSIVE METABOLIC PANEL
ALT: 11 U/L (ref 0–44)
AST: 15 U/L (ref 15–41)
Albumin: 4.4 g/dL (ref 3.5–5.0)
Alkaline Phosphatase: 56 U/L (ref 38–126)
Anion gap: 11 (ref 5–15)
BUN: 7 mg/dL (ref 6–20)
CO2: 23 mmol/L (ref 22–32)
Calcium: 9.6 mg/dL (ref 8.9–10.3)
Chloride: 101 mmol/L (ref 98–111)
Creatinine, Ser: 0.66 mg/dL (ref 0.44–1.00)
GFR, Estimated: >60 mL/min (ref >60)
Glucose, Bld: 127 mg/dL — ABNORMAL HIGH (ref 70–99)
Potassium: 3.5 mmol/L (ref 3.5–5.1)
Sodium: 135 mmol/L (ref 135–145)
Total Bilirubin: 1.1 mg/dL (ref 0.0–1.2)
Total Protein: 7.7 g/dL (ref 6.5–8.1)

## 2023-03-13 LAB — TYPE AND SCREEN
ABO/RH(D): O POS
Antibody Screen: NEGATIVE

## 2023-03-13 LAB — CBC
HCT: 37.5 % (ref 36.0–46.0)
Hemoglobin: 12.5 g/dL (ref 12.0–15.0)
MCH: 29.8 pg (ref 26.0–34.0)
MCHC: 33.3 g/dL (ref 30.0–36.0)
MCV: 89.3 fL (ref 80.0–100.0)
Platelets: 396 10*3/uL (ref 150–400)
RBC: 4.2 MIL/uL (ref 3.87–5.11)
RDW: 13.8 % (ref 11.5–15.5)
WBC: 19.1 10*3/uL — ABNORMAL HIGH (ref 4.0–10.5)
nRBC: 0 % (ref 0.0–0.2)

## 2023-03-13 LAB — HCG, QUANTITATIVE, PREGNANCY: hCG, Beta Chain, Quant, S: 822 m[IU]/mL — ABNORMAL HIGH (ref <5)

## 2023-03-13 LAB — LIPASE, BLOOD: Lipase: 25 U/L (ref 11–51)

## 2023-03-13 LAB — HCG, SERUM, QUALITATIVE: Preg, Serum: POSITIVE — AB

## 2023-03-13 SURGERY — LAPAROSCOPY, WITH ECTOPIC PREGNANCY SURGICAL TREATMENT
Anesthesia: General | Site: Uterus

## 2023-03-13 MED ORDER — ONDANSETRON HCL 4 MG/2ML IJ SOLN
INTRAMUSCULAR | Status: AC
  Filled 2023-03-13: qty 2

## 2023-03-13 MED ORDER — SODIUM CHLORIDE 0.9 % IR SOLN
Status: DC | PRN
  Administered 2023-03-13: 1000 mL

## 2023-03-13 MED ORDER — ONDANSETRON HCL 4 MG/2ML IJ SOLN: 4.0000 mg | Freq: Once | INTRAMUSCULAR | Status: DC

## 2023-03-13 MED ORDER — CHLORHEXIDINE GLUCONATE 0.12 % MT SOLN
15.0000 mL | Freq: Once | OROMUCOSAL | Status: AC
  Administered 2023-03-13: 15 mL via OROMUCOSAL
  Filled 2023-03-13: qty 15

## 2023-03-13 MED ORDER — HYDROMORPHONE HCL 1 MG/ML IJ SOLN
0.2500 mg | INTRAMUSCULAR | Status: DC | PRN
  Administered 2023-03-13 (×4): 0.5 mg via INTRAVENOUS

## 2023-03-13 MED ORDER — ORAL CARE MOUTH RINSE: 15.0000 mL | Freq: Once | OROMUCOSAL | Status: AC

## 2023-03-13 MED ORDER — ONDANSETRON 4 MG PO TBDP
4.0000 mg | ORAL_TABLET | Freq: Once | ORAL | Status: AC | PRN
  Administered 2023-03-13: 4 mg via ORAL
  Filled 2023-03-13: qty 1

## 2023-03-13 MED ORDER — LACTATED RINGERS IV SOLN: INTRAVENOUS | Status: DC

## 2023-03-13 MED ORDER — HYDROMORPHONE HCL 1 MG/ML IJ SOLN
INTRAMUSCULAR | Status: AC
  Filled 2023-03-13: qty 1

## 2023-03-13 MED ORDER — BUPIVACAINE HCL (PF) 0.25 % IJ SOLN
INTRAMUSCULAR | Status: AC
  Filled 2023-03-13: qty 30

## 2023-03-13 MED ORDER — BUPIVACAINE HCL 0.25 % IJ SOLN
INTRAMUSCULAR | Status: DC | PRN
  Administered 2023-03-13: 7 mL
  Administered 2023-03-13: 6 mL

## 2023-03-13 MED ORDER — FENTANYL CITRATE (PF) 100 MCG/2ML IJ SOLN
25.0000 ug | INTRAMUSCULAR | Status: DC | PRN
  Administered 2023-03-13 (×3): 50 ug via INTRAVENOUS

## 2023-03-13 MED ORDER — SOD CITRATE-CITRIC ACID 500-334 MG/5ML PO SOLN: 30.0000 mL | ORAL | Status: AC

## 2023-03-13 MED ORDER — PROPOFOL 10 MG/ML IV BOLUS
INTRAVENOUS | Status: DC | PRN
Start: 2023-03-13 — End: 2023-03-13
  Administered 2023-03-13: 140 mg via INTRAVENOUS

## 2023-03-13 MED ORDER — ACETAMINOPHEN 500 MG PO TABS
1000.0000 mg | ORAL_TABLET | ORAL | Status: AC
  Administered 2023-03-13: 1000 mg via ORAL
  Filled 2023-03-13: qty 2

## 2023-03-13 MED ORDER — DEXAMETHASONE SODIUM PHOSPHATE 10 MG/ML IJ SOLN
INTRAMUSCULAR | Status: AC
  Filled 2023-03-13: qty 1

## 2023-03-13 MED ORDER — 0.9 % SODIUM CHLORIDE (POUR BTL) OPTIME
TOPICAL | Status: DC | PRN
  Administered 2023-03-13: 1000 mL

## 2023-03-13 MED ORDER — HYDROMORPHONE HCL 1 MG/ML IJ SOLN: INTRAMUSCULAR | Status: DC | PRN

## 2023-03-13 MED ORDER — ALBUMIN HUMAN 5 % IV SOLN: INTRAVENOUS | Status: DC | PRN

## 2023-03-13 MED ORDER — OXYCODONE HCL 5 MG PO TABS
5.0000 mg | ORAL_TABLET | Freq: Once | ORAL | Status: AC
  Administered 2023-03-13: 5 mg via ORAL

## 2023-03-13 MED ORDER — HEMOSTATIC AGENTS (NO CHARGE) OPTIME
TOPICAL | Status: DC | PRN
  Administered 2023-03-13: 1 via TOPICAL

## 2023-03-13 MED ORDER — FENTANYL CITRATE (PF) 100 MCG/2ML IJ SOLN
100.0000 ug | Freq: Once | INTRAMUSCULAR | Status: AC
  Administered 2023-03-13: 100 ug via INTRAVENOUS
  Filled 2023-03-13: qty 2

## 2023-03-13 MED ORDER — ONDANSETRON HCL 4 MG/2ML IJ SOLN
INTRAMUSCULAR | Status: DC | PRN
  Administered 2023-03-13: 4 mg via INTRAVENOUS

## 2023-03-13 MED ORDER — FENTANYL CITRATE (PF) 100 MCG/2ML IJ SOLN
INTRAMUSCULAR | Status: AC
  Filled 2023-03-13: qty 2

## 2023-03-13 MED ORDER — DEXMEDETOMIDINE HCL IN NACL 80 MCG/20ML IV SOLN
INTRAVENOUS | Status: DC | PRN
  Administered 2023-03-13: 4 ug via INTRAVENOUS
  Administered 2023-03-13 (×2): 8 ug via INTRAVENOUS

## 2023-03-13 MED ORDER — LIDOCAINE 2% (20 MG/ML) 5 ML SYRINGE
INTRAMUSCULAR | Status: AC
  Filled 2023-03-13: qty 5

## 2023-03-13 MED ORDER — IBUPROFEN 600 MG PO TABS: 600.0000 mg | ORAL_TABLET | Freq: Four times a day (QID) | ORAL | 2 refills | Status: AC | PRN

## 2023-03-13 MED ORDER — MIDAZOLAM HCL 2 MG/2ML IJ SOLN
INTRAMUSCULAR | Status: DC | PRN
Start: 2023-03-13 — End: 2023-03-13
  Administered 2023-03-13: 2 mg via INTRAVENOUS

## 2023-03-13 MED ORDER — LIDOCAINE 2% (20 MG/ML) 5 ML SYRINGE
INTRAMUSCULAR | Status: DC | PRN
  Administered 2023-03-13: 40 mg via INTRAVENOUS

## 2023-03-13 MED ORDER — MIDAZOLAM HCL 2 MG/2ML IJ SOLN
INTRAMUSCULAR | Status: AC
  Filled 2023-03-13: qty 2

## 2023-03-13 MED ORDER — FENTANYL CITRATE (PF) 250 MCG/5ML IJ SOLN
INTRAMUSCULAR | Status: AC
  Filled 2023-03-13: qty 5

## 2023-03-13 MED ORDER — HYDROMORPHONE HCL 1 MG/ML IJ SOLN
INTRAMUSCULAR | Status: DC | PRN
  Administered 2023-03-13: .5 mg via INTRAVENOUS

## 2023-03-13 MED ORDER — PHENYLEPHRINE 80 MCG/ML (10ML) SYRINGE FOR IV PUSH (FOR BLOOD PRESSURE SUPPORT)
PREFILLED_SYRINGE | INTRAVENOUS | Status: DC | PRN
Start: 2023-03-13 — End: 2023-03-13
  Administered 2023-03-13 (×2): 80 ug via INTRAVENOUS

## 2023-03-13 MED ORDER — ROCURONIUM BROMIDE 10 MG/ML (PF) SYRINGE
PREFILLED_SYRINGE | INTRAVENOUS | Status: AC
  Filled 2023-03-13: qty 10

## 2023-03-13 MED ORDER — OXYCODONE HCL 5 MG PO TABS
ORAL_TABLET | ORAL | Status: DC
Start: 2023-03-13 — End: 2023-03-13
  Filled 2023-03-13: qty 1

## 2023-03-13 MED ORDER — HYDROMORPHONE HCL 1 MG/ML IJ SOLN
INTRAMUSCULAR | Status: AC
  Filled 2023-03-13: qty 0.5

## 2023-03-13 MED ORDER — DEXAMETHASONE SODIUM PHOSPHATE 10 MG/ML IJ SOLN
INTRAMUSCULAR | Status: DC | PRN
  Administered 2023-03-13: 10 mg via INTRAVENOUS

## 2023-03-13 MED ORDER — ROCURONIUM BROMIDE 10 MG/ML (PF) SYRINGE
PREFILLED_SYRINGE | INTRAVENOUS | Status: DC | PRN
  Administered 2023-03-13: 10 mg via INTRAVENOUS
  Administered 2023-03-13: 30 mg via INTRAVENOUS
  Administered 2023-03-13 (×2): 10 mg via INTRAVENOUS

## 2023-03-13 MED ORDER — PROPOFOL 10 MG/ML IV BOLUS
INTRAVENOUS | Status: AC
  Filled 2023-03-13: qty 20

## 2023-03-13 MED ORDER — PHENYLEPHRINE 80 MCG/ML (10ML) SYRINGE FOR IV PUSH (FOR BLOOD PRESSURE SUPPORT)
PREFILLED_SYRINGE | INTRAVENOUS | Status: AC
  Filled 2023-03-13: qty 10

## 2023-03-13 MED ORDER — SUCCINYLCHOLINE CHLORIDE 200 MG/10ML IV SOSY
PREFILLED_SYRINGE | INTRAVENOUS | Status: DC | PRN
Start: 2023-03-13 — End: 2023-03-13
  Administered 2023-03-13: 120 mg via INTRAVENOUS

## 2023-03-13 MED ORDER — FENTANYL CITRATE (PF) 250 MCG/5ML IJ SOLN
INTRAMUSCULAR | Status: DC | PRN
  Administered 2023-03-13: 100 ug via INTRAVENOUS
  Administered 2023-03-13 (×3): 50 ug via INTRAVENOUS

## 2023-03-13 MED ORDER — CEFAZOLIN SODIUM-DEXTROSE 2-4 GM/100ML-% IV SOLN
INTRAVENOUS | Status: AC
  Filled 2023-03-13: qty 100

## 2023-03-13 MED ORDER — CEFAZOLIN SODIUM-DEXTROSE 2-4 GM/100ML-% IV SOLN
2.0000 g | INTRAVENOUS | Status: AC
  Administered 2023-03-13: 2 g via INTRAVENOUS

## 2023-03-13 MED ORDER — OXYCODONE HCL 5 MG PO TABS: 5.0000 mg | ORAL_TABLET | Freq: Four times a day (QID) | ORAL | 0 refills | Status: DC | PRN

## 2023-03-13 MED ORDER — POVIDONE-IODINE 10 % EX SWAB
2.0000 | Freq: Once | CUTANEOUS | Status: AC
  Administered 2023-03-13: 2 via TOPICAL

## 2023-03-13 MED ORDER — SOD CITRATE-CITRIC ACID 500-334 MG/5ML PO SOLN
ORAL | Status: AC
Start: 2023-03-13 — End: 2023-03-13
  Administered 2023-03-13: 30 mL via ORAL
  Filled 2023-03-13: qty 30

## 2023-03-13 MED ORDER — SUGAMMADEX SODIUM 200 MG/2ML IV SOLN
INTRAVENOUS | Status: DC | PRN
  Administered 2023-03-13: 125 mg via INTRAVENOUS

## 2023-03-13 SURGICAL SUPPLY — 31 items
APPLICATOR ARISTA FLEXITIP XL (MISCELLANEOUS) IMPLANT
DERMABOND ADVANCED .7 DNX12 (GAUZE/BANDAGES/DRESSINGS) ×2 IMPLANT
DRSG OPSITE POSTOP 3X4 (GAUZE/BANDAGES/DRESSINGS) ×2 IMPLANT
DURAPREP 26ML APPLICATOR (WOUND CARE) ×2 IMPLANT
GLOVE BIOGEL PI IND STRL 8 (GLOVE) ×2 IMPLANT
GLOVE SURG ORTHO 8.0 STRL STRW (GLOVE) ×2 IMPLANT
GOWN STRL REUS W/ TWL LRG LVL3 (GOWN DISPOSABLE) ×2 IMPLANT
GOWN STRL REUS W/ TWL XL LVL3 (GOWN DISPOSABLE) ×2 IMPLANT
HEMOSTAT ARISTA ABSORB 3G PWDR (HEMOSTASIS) IMPLANT
IRRIG SUCT STRYKERFLOW 2 WTIP (MISCELLANEOUS) ×2 IMPLANT
IRRIGATION SUCT STRKRFLW 2 WTP (MISCELLANEOUS) IMPLANT
KIT PINK PAD W/HEAD ARE REST (MISCELLANEOUS) ×2 IMPLANT
KIT PINK PAD W/HEAD ARM REST (MISCELLANEOUS) ×4 IMPLANT
KIT TURNOVER KIT B (KITS) ×2 IMPLANT
LIGASURE LAP MARYLAND 5MM 37CM (ELECTROSURGICAL) IMPLANT
LIGASURE VESSEL 5MM BLUNT TIP (ELECTROSURGICAL) ×2 IMPLANT
NS IRRIG 1000ML POUR BTL (IV SOLUTION) ×2 IMPLANT
PACK LAPAROSCOPY BASIN (CUSTOM PROCEDURE TRAY) ×2 IMPLANT
PROTECTOR NERVE ULNAR (MISCELLANEOUS) ×4 IMPLANT
SET TUBE SMOKE EVAC HIGH FLOW (TUBING) ×2 IMPLANT
SLEEVE XCEL OPT CAN 5 100 (ENDOMECHANICALS) ×2 IMPLANT
SUT MNCRL AB 4-0 PS2 18 (SUTURE) ×2 IMPLANT
SUT VICRYL 0 UR6 27IN ABS (SUTURE) ×2 IMPLANT
SYS BAG RETRIEVAL 10MM (BASKET) ×2 IMPLANT
SYSTEM BAG RETRIEVAL 10MM (BASKET) IMPLANT
TOWEL GREEN STERILE FF (TOWEL DISPOSABLE) ×4 IMPLANT
TRAY FOLEY W/BAG SLVR 14FR (SET/KITS/TRAYS/PACK) ×2 IMPLANT
TROCAR 11X100 Z THREAD (TROCAR) ×2 IMPLANT
TROCAR XCEL NON-BLD 5MMX100MML (ENDOMECHANICALS) ×2 IMPLANT
TROCAR Z-THREAD OPTICAL 5X100M (TROCAR) IMPLANT
WARMER LAPAROSCOPE (MISCELLANEOUS) ×2 IMPLANT

## 2023-03-13 NOTE — ED Provider Triage Note (Signed)
Emergency Medicine Provider Triage Evaluation Note  Marissa Boyle , a 33 y.o. female  was evaluated in triage.  Pt complains of LLQ abd painx1 month, but worse tonight. Has vomited 10x tonight. Pain is sharp and stabbing. Has had intermittent bleeding for the last month. .  Review of Systems  Positive: Abdominal pain Negative: fevers  Physical Exam  BP (!) 139/105 (BP Location: Right Arm)   Pulse 82   Temp 98.4 F (36.9 C)   Resp (!) 22   Ht 5\' 2"  (1.575 m)   Wt 58.1 kg   LMP 03/13/2023 (Approximate)   SpO2 100%   BMI 23.41 kg/m  Gen:   Awake, no distress   Resp:  Normal effort  MSK:   Moves extremities without difficulty  Other:  +LLQ abd pain w/guarding   Medical Decision Making  Medically screening exam initiated at 6:22 AM.  Appropriate orders placed.  Marissa Boyle was informed that the remainder of the evaluation will be completed by another provider, this initial triage assessment does not replace that evaluation, and the importance of remaining in the ED until their evaluation is complete.  +pregnancy test, no known IUP, pt did not know she was pregnant, concern for ectopic pregnancy given severity of pain. Attempted to call [facility] APP, APP not available at this time. Called Dr. Jolayne Panther faculty on call, they agree w/sending to MAU, I secure chatted her the pt's info.     Marissa Boyle, [place] 03/13/23 [phone]

## 2023-03-13 NOTE — MAU Note (Addendum)
..  Marissa Boyle is a 33 y.o. at Unknown here in MAU, patient brought back to room d/t intensity of pain reporting: Sharp/stabbing lower left abdominal pain that started last month. Reports the pain has stayed the same intensity but now it has gotten to "where she can not handle it".  Took zofran in ED, has not taken any other medications.  LMP: 02/01/2023  Pain score: 10/10 Vitals:   03/13/23 0646 03/13/23 0650  BP: (!) 159/98   Pulse: 76   Resp: 20   Temp: 97.8 F (36.6 C)   SpO2:  100%

## 2023-03-13 NOTE — Anesthesia Preprocedure Evaluation (Addendum)
Anesthesia Evaluation  Patient identified by MRN, date of birth, ID band Patient awake    Reviewed: Allergy & Precautions, NPO status , Patient's Chart, lab work & pertinent test results  Airway Mallampati: II  TM Distance: >3 FB Neck ROM: Full    Dental  (+) Chipped, Dental Advisory Given,    Pulmonary Current Smoker and Patient abstained from smoking.   Pulmonary exam normal breath sounds clear to auscultation       Cardiovascular negative cardio ROS Normal cardiovascular exam Rhythm:Regular Rate:Normal     Neuro/Psych negative neurological ROS  negative psych ROS   GI/Hepatic negative GI ROS,,,(+)     substance abuse  marijuana use  Endo/Other  negative endocrine ROS    Renal/GU negative Renal ROS  negative genitourinary   Musculoskeletal negative musculoskeletal ROS (+)    Abdominal   Peds  Hematology negative hematology ROS (+)   Anesthesia Other Findings   Reproductive/Obstetrics                             Anesthesia Physical Anesthesia Plan  ASA: 2  Anesthesia Plan: General   Post-op Pain Management: Tylenol PO (pre-op)*   Induction: Intravenous and Rapid sequence  PONV Risk Score and Plan: 2 and Midazolam, Dexamethasone and Ondansetron  Airway Management Planned: Oral ETT  Additional Equipment:   Intra-op Plan:   Post-operative Plan: Extubation in OR  Informed Consent: I have reviewed the patients History and Physical, chart, labs and discussed the procedure including the risks, benefits and alternatives for the proposed anesthesia with the patient or authorized representative who has indicated his/her understanding and acceptance.     Dental advisory given  Plan Discussed with: CRNA  Anesthesia Plan Comments:        Anesthesia Quick Evaluation

## 2023-03-13 NOTE — MAU Provider Note (Addendum)
Chief Complaint: Abdominal Pain and Emesis   Event Date/Time   First Provider Initiated Contact with Patient 03/13/23 [phone]        SUBJECTIVE HPI: Marissa Boyle is a 33 y.o. G1P0 at [redacted]w[redacted]d by LMP who presents to maternity admissions from [facility] ED reporting lower abdominal pain for a month which worsened tonight.  Pain is sharp and stabbing in nature. She has had no bleeding.   This is her first pregnancy.  Has not had any evaluation for it yet.  ROS unable to obtain full ROS due to Level 5 Caveat : several    Abdominal Pain This is a recurrent problem. The onset quality is sudden. The problem occurs constantly. The problem has been rapidly worsening. The pain is located in the LLQ, RLQ and suprapubic region. The pain is at a severity of 10/10. The pain is severe. The quality of the pain is sharp and tearing. Associated symptoms include nausea and vomiting. Pertinent negatives include no fever. The pain is aggravated by certain positions, movement and palpation. The pain is relieved by Nothing. She has tried nothing for the symptoms.   RN Note: GUDELIA Boyle is a 33 y.o. at Unknown here in MAU, patient brought back to room d/t intensity of pain reporting: Sharp/stabbing lower left abdominal pain that started last month. Reports the pain has stayed the same intensity but now it has gotten to "where she can not handle it".  Took zofran in ED, has not taken any other medications.  LMP: 02/01/2023  History reviewed. No pertinent past medical history. Past Surgical History:  Procedure Laterality Date   CHOLECYSTECTOMY     Social History   Socioeconomic History   Marital status: Single    Spouse name: Not on file   Number of children: Not on file   Years of education: Not on file   Highest education level: Not on file  Occupational History   Not on file  Tobacco Use   Smoking status: Every Day    Current packs/day: 0.25    Types: Cigarettes   Smokeless tobacco: Never  Vaping Use    Vaping status: Never Used  Substance and Sexual Activity   Alcohol use: Yes   Drug use: Yes    Types: Marijuana   Sexual activity: Yes    Birth control/protection: None, Injection  Other Topics Concern   Not on file  Social History Narrative   ** Merged History Encounter **       Social Drivers of Corporate investment banker Strain: Not on file  Food Insecurity: Not on file  Transportation Needs: Not on file  Physical Activity: Not on file  Stress: Not on file  Social Connections: Not on file  Intimate Partner Violence: Not on file   No current facility-administered medications on file prior to encounter.   Current Outpatient Medications on File Prior to Encounter  Medication Sig Dispense Refill   clindamycin (CLEOCIN) 300 MG capsule Take 1 capsule (300 mg total) by mouth every 6 (six) hours. 40 capsule 0   diclofenac (VOLTAREN) 75 MG EC tablet Take 1 tablet (75 mg total) by mouth 2 (two) times daily. 12 tablet 0   HYDROcodone-acetaminophen (NORCO/VICODIN) 5-325 MG per tablet Take 2 tablets by mouth every 4 (four) hours as needed. 20 tablet 0   ibuprofen (ADVIL,MOTRIN) 600 MG tablet Take 1 tablet (600 mg total) by mouth every 6 (six) hours as needed. 30 tablet 0   potassium chloride SA (KLOR-CON M)  20 MEQ tablet Take 1 tablet (20 mEq total) by mouth daily for 3 days. 3 tablet 0   zolpidem (AMBIEN) 5 MG tablet Take 5 mg by mouth at bedtime as needed for sleep.     No Known Allergies  I have reviewed patient's Past Medical Hx, Surgical Hx, Family Hx, Social Hx, medications and allergies.   ROS:  Review of Systems  Constitutional:  Negative for fever.  Gastrointestinal:  Positive for abdominal pain, nausea and vomiting.   Review of Systems  Other systems negative   Physical Exam  Physical Exam Patient Vitals for the past 24 hrs:  BP Temp Pulse Resp SpO2 Height Weight  03/13/23 0650 -- -- -- -- 100 % -- --  03/13/23 0646 (!) 159/98 97.8 F (36.6 C) 76 20 -- -- --   03/13/23 0447 (!) 139/105 98.4 F (36.9 C) 82 (!) 22 100 % -- --  03/13/23 0447 -- -- -- -- -- 5\' 2"  (1.575 m) 58.1 kg   Constitutional: Well-developed, well-nourished female in no acute distress but writhing in pain  Cardiovascular: normal rate Respiratory: normal effort GI: Abd soft, extremely tender over lower abdomen. MS: Extremities nontender, no edema, normal ROM Neurologic: Alert and oriented x 4.  GU: Neg CVAT.  PELVIC EXAM: deferred  LAB RESULTS Results for orders placed or performed during the hospital encounter of 03/13/23 (from the past 24 hours)  Lipase, blood     Status: None   Collection Time: 03/13/23  5:00 AM  Result Value Ref Range   Lipase 25 11 - 51 U/L  Comprehensive metabolic panel     Status: Abnormal   Collection Time: 03/13/23  5:00 AM  Result Value Ref Range   Sodium 135 135 - 145 mmol/L   Potassium 3.5 3.5 - 5.1 mmol/L   Chloride 101 98 - 111 mmol/L   CO2 23 22 - 32 mmol/L   Glucose, Bld 127 (H) 70 - 99 mg/dL   BUN 7 6 - 20 mg/dL   Creatinine, Ser 9.14 0.44 - 1.00 mg/dL   Calcium 9.6 8.9 - 78.2 mg/dL   Total Protein 7.7 6.5 - 8.1 g/dL   Albumin 4.4 3.5 - 5.0 g/dL   AST 15 15 - 41 U/L   ALT 11 0 - 44 U/L   Alkaline Phosphatase 56 38 - 126 U/L   Total Bilirubin 1.1 0.0 - 1.2 mg/dL   GFR, Estimated >95 >62 mL/min   Anion gap 11 5 - 15  CBC     Status: Abnormal   Collection Time: 03/13/23  5:00 AM  Result Value Ref Range   WBC 19.1 (H) 4.0 - 10.5 K/uL   RBC 4.20 3.87 - 5.11 MIL/uL   Hemoglobin 12.5 12.0 - 15.0 g/dL   HCT 13.0 86.5 - 78.4 %   MCV 89.3 80.0 - 100.0 fL   MCH 29.8 26.0 - 34.0 pg   MCHC 33.3 30.0 - 36.0 g/dL   RDW 69.6 29.5 - 28.4 %   Platelets 396 150 - 400 K/uL   nRBC 0.0 0.0 - 0.2 %  Urinalysis, Routine w reflex microscopic -Urine, Clean Catch     Status: Abnormal   Collection Time: 03/13/23  5:00 AM  Result Value Ref Range   Color, Urine YELLOW YELLOW   APPearance HAZY (A) CLEAR   Specific Gravity, Urine 1.023 1.005 -  1.030   pH 6.0 5.0 - 8.0   Glucose, UA NEGATIVE NEGATIVE mg/dL   Hgb urine dipstick SMALL (A) NEGATIVE  Bilirubin Urine NEGATIVE NEGATIVE   Ketones, ur 5 (A) NEGATIVE mg/dL   Protein, ur 30 (A) NEGATIVE mg/dL   Nitrite NEGATIVE NEGATIVE   Leukocytes,Ua NEGATIVE NEGATIVE   RBC / HPF 6-10 0 - 5 RBC/hpf   WBC, UA 0-5 0 - 5 WBC/hpf   Bacteria, UA FEW (A) NONE SEEN   Squamous Epithelial / HPF 0-5 0 - 5 /HPF   Mucus PRESENT    Amorphous Crystal PRESENT   hCG, serum, qualitative     Status: Abnormal   Collection Time: 03/13/23  5:00 AM  Result Value Ref Range   Preg, Serum POSITIVE (A) NEGATIVE  hCG, quantitative, pregnancy     Status: Abnormal   Collection Time: 03/13/23  5:00 AM  Result Value Ref Range   hCG, Beta Chain, Quant, S 822 (H) <5 mIU/mL  Type and screen [facility]     Status: None   Collection Time: 03/13/23  6:58 AM  Result Value Ref Range   ABO/RH(D) O POS    Antibody Screen NEG    Sample Expiration      03/16/2023,2359 Performed at [facility], [address] [place], [place] [address]      IMAGING US OB LESS THAN 14 WEEKS WITH OB TRANSVAGINAL Result Date: 03/13/2023 CLINICAL DATA:  33 year old potentially pregnant female with history of pelvic cramping. EXAM: OBSTETRIC <14 WK [place] AND TRANSVAGINAL OB US TECHNIQUE: Both transabdominal and transvaginal ultrasound examinations were performed for complete evaluation of the gestation as well as the maternal uterus, adnexal regions, and pelvic cul-de-sac. Transvaginal technique was performed to assess early pregnancy. COMPARISON:  Pelvic ultrasound 05/14/2008. FINDINGS: Intrauterine gestational sac: None Yolk sac:  None Embryo:  None Cardiac Activity: None Heart Rate: N/A Subchorionic hemorrhage:  None visualized. Maternal uterus/adnexae: Uterus and right ovary are unremarkable in appearance. Large mass in the left adnexa demonstrating heterogeneous echogenicity and internal blood flow measuring up to  8.4 x 6.0 x 7.5 cm. Small volume of free fluid in the cul-de-sac. IMPRESSION: 1. No IUP identified. 2. Large mass in the left adnexa, concerning for a potential ovarian neoplasm, although the origin of this lesion is uncertain. Further clinical evaluation is recommended. Contrast-enhanced CT of the abdomen and pelvis could be considered to provide further anatomic delineation of the lesion. Electronically Signed   By: Trudie Reed M.D.   On: 03/13/2023 07:44     MAU Management/MDM: I have reviewed the triage vital signs and the nursing notes.   Pertinent labs & imaging results that were available during my care of the patient were reviewed by me and considered in my medical decision making (see chart for details).      I have reviewed her medical records including past results, notes and treatments. Medical, Surgical, and family history were reviewed.  Medications and recent lab tests were reviewed  Ordered usual first trimester r/o ectopic labs.   Will check baseline Ultrasound to rule out ectopic.  >> US showed large left adnexal mass with free fluid and clot in cul de sac suggesting ruptured ectopic.  Consult Dr Jolayne Panther with presentation, exam findings, and results.   Treatments in MAU included IV, Fentanyl and Zofran.   This bleeding/pain can represent a normal pregnancy with bleeding, spontaneous abortion or even an ectopic which can be life-threatening.  The process as listed above helps to determine which of these is present.  Discussed findings with patient including [place] and blood results which suggest ruptured ectopic pregnancy  Discussed surgical treatment has been recommended.  ASSESSMENT Left ectopic pregnancy at [redacted]w[redacted]d by LMP Severe pelvic pain Ruptured ectopic pregnancy  PLAN Admit to OR for surgical intervention Dr Donavan Foil updated    Wynelle Bourgeois CNM, MSN Certified Nurse-Midwife 03/13/2023  6:53 AM

## 2023-03-13 NOTE — H&P (Addendum)
 Chief Complaint: Abdominal Pain and Emesis   Event Date/Time   First Provider Initiated Contact with Patient 03/13/23 [phone]        SUBJECTIVE HPI: Marissa Boyle is a 33 y.o. G1P0 at [redacted]w[redacted]d by LMP who presents to maternity admissions from [facility] ED reporting lower abdominal pain for a month which worsened tonight.  Pain is sharp and stabbing in nature. She has had no bleeding.   This is her first pregnancy.  Has not had any evaluation for it yet.  ROS unable to obtain full ROS due to Level 5 Caveat : several    Abdominal Pain This is a recurrent problem. The onset quality is sudden. The problem occurs constantly. The problem has been rapidly worsening. The pain is located in the LLQ, RLQ and suprapubic region. The pain is at a severity of 10/10. The pain is severe. The quality of the pain is sharp and tearing. Associated symptoms include nausea and vomiting. Pertinent negatives include no fever. The pain is aggravated by certain positions, movement and palpation. The pain is relieved by Nothing. She has tried nothing for the symptoms.   RN Note: Marissa Boyle is a 33 y.o. at Unknown here in MAU, patient brought back to room d/t intensity of pain reporting: Sharp/stabbing lower left abdominal pain that started last month. Reports the pain has stayed the same intensity but now it has gotten to "where she can not handle it".  Took zofran in ED, has not taken any other medications.  LMP: 02/01/2023  History reviewed. No pertinent past medical history. Past Surgical History:  Procedure Laterality Date   CHOLECYSTECTOMY     Social History   Socioeconomic History   Marital status: Single    Spouse name: Not on file   Number of children: Not on file   Years of education: Not on file   Highest education level: Not on file  Occupational History   Not on file  Tobacco Use   Smoking status: Every Day    Current packs/day: 0.25    Types: Cigarettes   Smokeless tobacco: Never  Vaping Use    Vaping status: Never Used  Substance and Sexual Activity   Alcohol use: Yes   Drug use: Yes    Types: Marijuana   Sexual activity: Yes    Birth control/protection: None, Injection  Other Topics Concern   Not on file  Social History Narrative   ** Merged History Encounter **       Social Drivers of Corporate investment banker Strain: Not on file  Food Insecurity: Not on file  Transportation Needs: Not on file  Physical Activity: Not on file  Stress: Not on file  Social Connections: Not on file  Intimate Partner Violence: Not on file   No current facility-administered medications on file prior to encounter.   Current Outpatient Medications on File Prior to Encounter  Medication Sig Dispense Refill   clindamycin (CLEOCIN) 300 MG capsule Take 1 capsule (300 mg total) by mouth every 6 (six) hours. 40 capsule 0   diclofenac (VOLTAREN) 75 MG EC tablet Take 1 tablet (75 mg total) by mouth 2 (two) times daily. 12 tablet 0   HYDROcodone-acetaminophen (NORCO/VICODIN) 5-325 MG per tablet Take 2 tablets by mouth every 4 (four) hours as needed. 20 tablet 0   ibuprofen (ADVIL,MOTRIN) 600 MG tablet Take 1 tablet (600 mg total) by mouth every 6 (six) hours as needed. 30 tablet 0   potassium chloride SA (KLOR-CON M)  20 MEQ tablet Take 1 tablet (20 mEq total) by mouth daily for 3 days. 3 tablet 0   zolpidem (AMBIEN) 5 MG tablet Take 5 mg by mouth at bedtime as needed for sleep.     No Known Allergies  I have reviewed patient's Past Medical Hx, Surgical Hx, Family Hx, Social Hx, medications and allergies.   ROS:  Review of Systems  Constitutional:  Negative for fever.  Gastrointestinal:  Positive for abdominal pain, nausea and vomiting.   Review of Systems  Other systems negative   Physical Exam  Physical Exam Patient Vitals for the past 24 hrs:  BP Temp Pulse Resp SpO2 Height Weight  03/13/23 0650 -- -- -- -- 100 % -- --  03/13/23 0646 (!) 159/98 97.8 F (36.6 C) 76 20 -- -- --   03/13/23 0447 (!) 139/105 98.4 F (36.9 C) 82 (!) 22 100 % -- --  03/13/23 0447 -- -- -- -- -- 5\' 2"  (1.575 m) 58.1 kg   Constitutional: Well-developed, well-nourished female in no acute distress but writhing in pain  Cardiovascular: normal rate Respiratory: normal effort GI: Abd soft, extremely tender over lower abdomen. MS: Extremities nontender, no edema, normal ROM Neurologic: Alert and oriented x 4.  GU: Neg CVAT.  PELVIC EXAM: deferred  LAB RESULTS Results for orders placed or performed during the hospital encounter of 03/13/23 (from the past 24 hours)  Lipase, blood     Status: None   Collection Time: 03/13/23  5:00 AM  Result Value Ref Range   Lipase 25 11 - 51 U/L  Comprehensive metabolic panel     Status: Abnormal   Collection Time: 03/13/23  5:00 AM  Result Value Ref Range   Sodium 135 135 - 145 mmol/L   Potassium 3.5 3.5 - 5.1 mmol/L   Chloride 101 98 - 111 mmol/L   CO2 23 22 - 32 mmol/L   Glucose, Bld 127 (H) 70 - 99 mg/dL   BUN 7 6 - 20 mg/dL   Creatinine, Ser 4.69 0.44 - 1.00 mg/dL   Calcium 9.6 8.9 - 62.9 mg/dL   Total Protein 7.7 6.5 - 8.1 g/dL   Albumin 4.4 3.5 - 5.0 g/dL   AST 15 15 - 41 U/L   ALT 11 0 - 44 U/L   Alkaline Phosphatase 56 38 - 126 U/L   Total Bilirubin 1.1 0.0 - 1.2 mg/dL   GFR, Estimated >52 >84 mL/min   Anion gap 11 5 - 15  CBC     Status: Abnormal   Collection Time: 03/13/23  5:00 AM  Result Value Ref Range   WBC 19.1 (H) 4.0 - 10.5 K/uL   RBC 4.20 3.87 - 5.11 MIL/uL   Hemoglobin 12.5 12.0 - 15.0 g/dL   HCT 13.2 44.0 - 10.2 %   MCV 89.3 80.0 - 100.0 fL   MCH 29.8 26.0 - 34.0 pg   MCHC 33.3 30.0 - 36.0 g/dL   RDW 72.5 36.6 - 44.0 %   Platelets 396 150 - 400 K/uL   nRBC 0.0 0.0 - 0.2 %  Urinalysis, Routine w reflex microscopic -Urine, Clean Catch     Status: Abnormal   Collection Time: 03/13/23  5:00 AM  Result Value Ref Range   Color, Urine YELLOW YELLOW   APPearance HAZY (A) CLEAR   Specific Gravity, Urine 1.023 1.005 -  1.030   pH 6.0 5.0 - 8.0   Glucose, UA NEGATIVE NEGATIVE mg/dL   Hgb urine dipstick SMALL (A) NEGATIVE  Bilirubin Urine NEGATIVE NEGATIVE   Ketones, ur 5 (A) NEGATIVE mg/dL   Protein, ur 30 (A) NEGATIVE mg/dL   Nitrite NEGATIVE NEGATIVE   Leukocytes,Ua NEGATIVE NEGATIVE   RBC / HPF 6-10 0 - 5 RBC/hpf   WBC, UA 0-5 0 - 5 WBC/hpf   Bacteria, UA FEW (A) NONE SEEN   Squamous Epithelial / HPF 0-5 0 - 5 /HPF   Mucus PRESENT    Amorphous Crystal PRESENT   hCG, serum, qualitative     Status: Abnormal   Collection Time: 03/13/23  5:00 AM  Result Value Ref Range   Preg, Serum POSITIVE (A) NEGATIVE  hCG, quantitative, pregnancy     Status: Abnormal   Collection Time: 03/13/23  5:00 AM  Result Value Ref Range   hCG, Beta Chain, Quant, S 822 (H) <5 mIU/mL  Type and screen [facility]     Status: None   Collection Time: 03/13/23  6:58 AM  Result Value Ref Range   ABO/RH(D) O POS    Antibody Screen NEG    Sample Expiration      03/16/2023,2359 Performed at [facility], [address] [place], [place] [address]      IMAGING US OB LESS THAN 14 WEEKS WITH OB TRANSVAGINAL Result Date: 03/13/2023 CLINICAL DATA:  33 year old potentially pregnant female with history of pelvic cramping. EXAM: OBSTETRIC <14 WK [place] AND TRANSVAGINAL OB US TECHNIQUE: Both transabdominal and transvaginal ultrasound examinations were performed for complete evaluation of the gestation as well as the maternal uterus, adnexal regions, and pelvic cul-de-sac. Transvaginal technique was performed to assess early pregnancy. COMPARISON:  Pelvic ultrasound 05/14/2008. FINDINGS: Intrauterine gestational sac: None Yolk sac:  None Embryo:  None Cardiac Activity: None Heart Rate: N/A Subchorionic hemorrhage:  None visualized. Maternal uterus/adnexae: Uterus and right ovary are unremarkable in appearance. Large mass in the left adnexa demonstrating heterogeneous echogenicity and internal blood flow measuring up to  8.4 x 6.0 x 7.5 cm. Small volume of free fluid in the cul-de-sac. IMPRESSION: 1. No IUP identified. 2. Large mass in the left adnexa, concerning for a potential ovarian neoplasm, although the origin of this lesion is uncertain. Further clinical evaluation is recommended. Contrast-enhanced CT of the abdomen and pelvis could be considered to provide further anatomic delineation of the lesion. Electronically Signed   By: Trudie Reed M.D.   On: 03/13/2023 07:44     MAU Management/MDM: I have reviewed the triage vital signs and the nursing notes.   Pertinent labs & imaging results that were available during my care of the patient were reviewed by me and considered in my medical decision making (see chart for details).      I have reviewed her medical records including past results, notes and treatments. Medical, Surgical, and family history were reviewed.  Medications and recent lab tests were reviewed  Ordered usual first trimester r/o ectopic labs.   Will check baseline Ultrasound to rule out ectopic.  >> US showed large left adnexal mass with free fluid and clot in cul de sac suggesting ruptured ectopic.  Consult Dr Jolayne Panther with presentation, exam findings, and results.   Treatments in MAU included IV, Fentanyl and Zofran.   This bleeding/pain can represent a normal pregnancy with bleeding, spontaneous abortion or even an ectopic which can be life-threatening.  The process as listed above helps to determine which of these is present.  Discussed findings with patient including [place] and blood results which suggest ruptured ectopic pregnancy  Discussed surgical treatment has been recommended.  ASSESSMENT Left ectopic pregnancy at [redacted]w[redacted]d by LMP Severe pelvic pain Ruptured ectopic pregnancy  PLAN Admit to OR for surgical intervention Dr Donavan Foil updated    Wynelle Bourgeois CNM, MSN Certified Nurse-Midwife 03/13/2023  6:53 AM   Attestation of Attending Supervision of Advanced Practice Provider  (CNM/NP/PA): Evaluation and management procedures were performed by the Advanced Practice Provider under my supervision and collaboration.  I have reviewed the Advanced Practice Provider's note and chart, and I agree with the management and plan. I have also made any necessary editorial changes.  Discussed patient with midwife directly and have also  seen and assessed the patient.   Warden Fillers, MD Attending Obstetrician & Gynecologist, [facility] for [facility], [facility] 03/13/2023 9:10 AM

## 2023-03-13 NOTE — Anesthesia Procedure Notes (Signed)
Procedure Name: Intubation Date/Time: 03/13/2023 9:20 AM  Performed by: Sharyn Dross, CRNAPre-anesthesia Checklist: Patient identified, Emergency Drugs available, Suction available and Patient being monitored Patient Re-evaluated:Patient Re-evaluated prior to induction Oxygen Delivery Method: Circle system utilized Preoxygenation: Pre-oxygenation with 100% oxygen Induction Type: IV induction Ventilation: Mask ventilation without difficulty Laryngoscope Size: Mac and 3 Grade View: Grade I Tube type: Oral Tube size: 7.0 mm Number of attempts: 1 Airway Equipment and Method: Stylet and Oral airway Placement Confirmation: ETT inserted through vocal cords under direct vision, positive ETCO2 and breath sounds checked- equal and bilateral Secured at: 22 cm Tube secured with: Tape Dental Injury: Teeth and Oropharynx as per pre-operative assessment

## 2023-03-13 NOTE — Progress Notes (Signed)
 Fentanyl wasted in stericycle with Jackelyn Knife RN.   Versie Starks, RN

## 2023-03-13 NOTE — Progress Notes (Signed)
33 y.o. G1P0 with abdominal pain and concerning ultrasound findings for ruptured ectopic pregnancy.   On exam, she had stable vital signs, and normal hemoglobin. Patient was counseled regarding need for operative laparoscopy and possible removal of damaged left fallopian tube. Risks of surgery including bleeding which may require transfusion or reoperation, infection, injury to bowel or other surrounding organs, need for additional procedures including (laparoscopy or) laparotomy were explained to patient and written informed consent was obtained.  Pt is aware she will still have fertility from her remaining tube.  Pt does note remote hx of STD x 2, (chlamydia ?) in the past.  Patient has been NPO since admission and she will remain NPO for procedure. Anesthesia and OR aware.  Preoperative prophylactic antibiotics and SCDs ordered on call to the OR.  To OR when ready.   Mariel Aloe, MD

## 2023-03-13 NOTE — Transfer of Care (Signed)
Immediate Anesthesia Transfer of Care Note  Patient: NAQUISHA WHITEHAIR  Procedure(s) Performed: DIAGNOSTIC LAPAROSCOPY WITH REMOVAL OF ECTOPIC PREGNANCY (Abdomen)  Patient Location: PACU  Anesthesia Type:General  Level of Consciousness: awake, alert , oriented, patient cooperative, and responds to stimulation  Airway & Oxygen Therapy: Patient Spontanous Breathing  Post-op Assessment: Report given to RN, Post -op Vital signs reviewed and stable, and Patient moving all extremities X 4  Post vital signs: Reviewed and stable  Last Vitals:  Vitals Value Taken Time  BP 136/92 03/13/23 1122  Temp    Pulse 104 03/13/23 1124  Resp 37 03/13/23 1124  SpO2 99 % 03/13/23 1124  Vitals shown include unfiled device data.  Last Pain:  Vitals:   03/13/23 0835  TempSrc:   PainSc: 5          Complications: There were no known notable events for this encounter.

## 2023-03-13 NOTE — ED Triage Notes (Signed)
  Patient comes in with L lower quadrant pain and emesis that has been going on since 2000 last night.  Patient states she was previously told she had ovarian cysts on the L side and was concerned about a possible rupture.  Patient endorsing 10/10, sharp/stabbing pain in LLQ.  States she has had around 10 episodes of emesis.  Took ibuprofen 800 mg around 0100 this morning.

## 2023-03-13 NOTE — Anesthesia Postprocedure Evaluation (Signed)
Anesthesia Post Note  Patient: Marissa Boyle  Procedure(s) Performed: DIAGNOSTIC LAPAROSCOPY WITH REMOVAL OF ECTOPIC PREGNANCY (Abdomen) LAPAROSCOPIC SALPINGECTOMY (Left: Uterus)     Patient location during evaluation: PACU Anesthesia Type: General Level of consciousness: awake and alert Pain management: pain level controlled Vital Signs Assessment: post-procedure vital signs reviewed and stable Respiratory status: spontaneous breathing, nonlabored ventilation, respiratory function stable and patient connected to nasal cannula oxygen Cardiovascular status: blood pressure returned to baseline and stable Postop Assessment: no apparent nausea or vomiting Anesthetic complications: no  There were no known notable events for this encounter.  Last Vitals:  Vitals:   03/13/23 1234 03/13/23 1245  BP:  126/88  Pulse: 91 76  Resp: (!) 33 17  Temp:  36.4 C  SpO2: 93% 95%    Last Pain:  Vitals:   03/13/23 1245  TempSrc:   PainSc: 0-No pain                 [name] L Kialee Kham

## 2023-03-13 NOTE — Op Note (Signed)
[facility] PROCEDURE DATE: 03/13/2023  PREOPERATIVE DIAGNOSES: left suspected ruptured ectopic pregnancy POSTOPERATIVE DIAGNOSES: left ruptured ectopic pregnancy with moderate adhesive disease PROCEDURE: operative laparoscopy, left salpingectomy, lysis of adhesions SURGEON:  Dr. Mariel Aloe ASSISTANT: Rivka Barbara, MD ANESTHESIOLOGY TEAM: Anesthesiologist: Elmer Picker, MD CRNA: Sharyn Dross, CRNA; Holtzman, Talbot Grumbling, CRNA  INDICATIONS: 33 y.o. G1P0 with aforementioned preoperative diagnoses here today for definitive surgical management.   Risks of surgery were discussed with the patient including but not limited to: bleeding which may require transfusion or reoperation; infection which may require antibiotics; injury to bowel, bladder, ureters or other surrounding organs; need for additional procedures including laparotomy; thromboembolic phenomenon, incisional problems and other postoperative/anesthesia complications. Written informed consent was obtained.    An experienced assistant was required given the standard of surgical care given the complexity of the case.  This assistant was needed for exposure, dissection, suctioning, retraction, instrument exchange and for overall help during the procedure.   FINDINGS:  left ruptured ectopic pregnancy, moderate adhesive disease/inflammatory response in the left adnexa  ANESTHESIA:  General anesthesia INTRAVENOUS FLUIDS: 800 ml ml ESTIMATED BLOOD LOSS: 25 ml UOP: 50 ml SPECIMENS:  left fallopian tube and ectopic pregnancy COMPLICATIONS: None immediate   PROCEDURE IN DETAIL:  The patient received intravenous antibiotics and had sequential compression devices applied to her lower extremities while in the preoperative area.  She was then taken to the operating room where general anesthesia was administered and was found to be adequate.  She was placed in the dorsal lithotomy position, and was prepped and draped in a sterile  manner.  A Foley catheter was inserted into her bladder and attached to constant drainage. A timeout was performed to verify patient and procedure. A uterine manipulator was then advanced into the uterus. Attention was then turned to the patient's abdomen where a 5-mm skin incision was made in the umbilical fold.     The 5 mm Optiview port was advanced under visualization with the endoscope until the peritoneum was entered. The introducer was removed and the camera was replaced within the port to confirm intraperitoneum placement. The abdomen was visualized and survey of the abdomen showed atraumatic entry. Survey of the abdomen and pelvis were noted as above.     A 5 mm port was placed into the right lower abdomen 2 cm proximal and median from the right ASIS after incision with a scalpel. The port was advanced under direct visualization.   A 10 mm port was placed in the left lower abdomen 2 cm proximal and medical to the left ASIS after incision with the scalpel. The port was advanced under direct visualization.   The patient was placed into trendelenburg and the left tube was grasped using an atraumatic grasper. There was extensive scarring from previous infection and the planes of the tube and left ovary well not well demarcated.  Dr. Briscoe Deutscher was called to assist at this time. There was a significant amount of adherent clot to the left adnexa which was removed using irrigation and suction.  The fallopian tube was removed using a 5 mm Ligasure to ligate and transect along the mesosalpinx just inferior to the tube. Hemostasis noted at ligation site. The tube was placed into a endocatch bag and removed from the abdomen. The pelvis was irrigated and suctioned with hemostasis noted at site. The right tube and ovary were inspected and noted to be normal. The abdomen was again inspected and no bleeding or other abnormalities noted.  A small amount of Arista was placed over the left adnexa.  Improved  hemostasis was noted. At this point the procedure was completed and the trocars were removed from the abdomen. The pneumoperitoneum was removed as much as possible. The fascia of the 10 mm port was closed using 0-Vicryl. The skin of all ports was closed using 4-0 Monocryl.   Dermabond was placed over each port site.  The uterine manipulator was removed and no bleeding noted at the site of the tenaculum.   The patient was extubated without difficulty and taken to PACU in stable condition.   The patient will be discharged to home as per PACU criteria.  Routine postoperative instructions given.  She was prescribed oxycodone  and Ibuprofen.  She will follow up in the clinic in 2 weeks for postoperative evaluation.   Mariel Aloe, MD

## 2023-03-14 ENCOUNTER — Encounter (HOSPITAL_COMMUNITY): Payer: Self-pay | Admitting: Obstetrics and Gynecology

## 2023-03-14 LAB — SURGICAL PATHOLOGY

## 2023-03-15 ENCOUNTER — Other Ambulatory Visit (INDEPENDENT_AMBULATORY_CARE_PROVIDER_SITE_OTHER): Payer: Self-pay | Admitting: Obstetrics and Gynecology

## 2023-03-16 ENCOUNTER — Other Ambulatory Visit: Payer: Self-pay | Admitting: Obstetrics & Gynecology

## 2023-03-16 DIAGNOSIS — G8918 Other acute postprocedural pain: Secondary | ICD-10-CM

## 2023-03-16 MED ORDER — OXYCODONE HCL 5 MG PO TABS: 5.0000 mg | ORAL_TABLET | Freq: Four times a day (QID) | ORAL | 0 refills | Status: DC | PRN

## 2023-03-16 MED ORDER — OXYCODONE HCL 5 MG PO TABS: 5.0000 mg | ORAL_TABLET | Freq: Four times a day (QID) | ORAL | 0 refills | Status: AC | PRN

## 2023-03-16 NOTE — Progress Notes (Signed)
     Faculty Practice OB/GYN Physician Phone Call Documentation  I had a phone conversation with after hours nurse about Marissa Boyle, she called to request  prescription for remaining 15 tablets of her Oxycodone as only 10 was given by pharmacy after surgery.  Also reported bleeding and changing pads every 3-4 hours.  Asked RN to reassure patient about bleeding, advise her to come i soaking more than one pad/hour or if she feels symptomatic. Bleeding is expected for the first couple of weeks after this surgery.  Oxycodone refilled as per patient's request.   Jaynie Collins, MD, FACOG Obstetrician & Gynecologist, [facility] for [employer], [facility]

## 2023-03-17 ENCOUNTER — Inpatient Hospital Stay (HOSPITAL_COMMUNITY)
Admission: AD | Admit: 2023-03-17 | Discharge: 2023-03-17 | Disposition: A | Discharge status: Home / Self Care | Payer: Managed Care, Other (non HMO) | Attending: Obstetrics & Gynecology | Admitting: Obstetrics & Gynecology

## 2023-03-17 ENCOUNTER — Encounter (HOSPITAL_COMMUNITY): Payer: Self-pay | Admitting: Obstetrics & Gynecology

## 2023-03-17 DIAGNOSIS — Z79899 Other long term (current) drug therapy: Secondary | ICD-10-CM | POA: Diagnosis not present

## 2023-03-17 DIAGNOSIS — N939 Abnormal uterine and vaginal bleeding, unspecified: Secondary | ICD-10-CM

## 2023-03-17 DIAGNOSIS — E876 Hypokalemia: Secondary | ICD-10-CM | POA: Diagnosis not present

## 2023-03-17 DIAGNOSIS — I159 Secondary hypertension, unspecified: Secondary | ICD-10-CM | POA: Diagnosis not present

## 2023-03-17 DIAGNOSIS — F419 Anxiety disorder, unspecified: Secondary | ICD-10-CM

## 2023-03-17 DIAGNOSIS — R102 Pelvic and perineal pain: Secondary | ICD-10-CM | POA: Diagnosis not present

## 2023-03-17 DIAGNOSIS — Z8759 Personal history of other complications of pregnancy, childbirth and the puerperium: Secondary | ICD-10-CM | POA: Insufficient documentation

## 2023-03-17 DIAGNOSIS — G8918 Other acute postprocedural pain: Secondary | ICD-10-CM | POA: Insufficient documentation

## 2023-03-17 LAB — COMPREHENSIVE METABOLIC PANEL
ALT: 14 U/L (ref 0–44)
AST: 18 U/L (ref 15–41)
Albumin: 4.7 g/dL (ref 3.5–5.0)
Alkaline Phosphatase: 55 U/L (ref 38–126)
Anion gap: 11 (ref 5–15)
BUN: 6 mg/dL (ref 6–20)
CO2: 25 mmol/L (ref 22–32)
Calcium: 9.4 mg/dL (ref 8.9–10.3)
Chloride: 97 mmol/L — ABNORMAL LOW (ref 98–111)
Creatinine, Ser: 0.73 mg/dL (ref 0.44–1.00)
GFR, Estimated: >60 mL/min (ref >60)
Glucose, Bld: 130 mg/dL — ABNORMAL HIGH (ref 70–99)
Potassium: 2.3 mmol/L — CL (ref 3.5–5.1)
Sodium: 133 mmol/L — ABNORMAL LOW (ref 135–145)
Total Bilirubin: 1.2 mg/dL (ref 0.0–1.2)
Total Protein: 8.4 g/dL — ABNORMAL HIGH (ref 6.5–8.1)

## 2023-03-17 LAB — CBC
HCT: 37.4 % (ref 36.0–46.0)
Hemoglobin: 12.7 g/dL (ref 12.0–15.0)
MCH: 29.7 pg (ref 26.0–34.0)
MCHC: 34 g/dL (ref 30.0–36.0)
MCV: 87.4 fL (ref 80.0–100.0)
Platelets: 444 10*3/uL — ABNORMAL HIGH (ref 150–400)
RBC: 4.28 MIL/uL (ref 3.87–5.11)
RDW: 13.6 % (ref 11.5–15.5)
WBC: 12.5 10*3/uL — ABNORMAL HIGH (ref 4.0–10.5)
nRBC: 0 % (ref 0.0–0.2)

## 2023-03-17 LAB — HCG, QUANTITATIVE, PREGNANCY: hCG, Beta Chain, Quant, S: 31 m[IU]/mL — ABNORMAL HIGH (ref <5)

## 2023-03-17 MED ORDER — OXYCODONE HCL 5 MG PO TABS
5.0000 mg | ORAL_TABLET | Freq: Four times a day (QID) | ORAL | Status: DC | PRN
  Administered 2023-03-17: 5 mg via ORAL
  Filled 2023-03-17: qty 1

## 2023-03-17 MED ORDER — HYDROXYZINE HCL 50 MG/ML IM SOLN
50.0000 mg | Freq: Once | INTRAMUSCULAR | Status: AC
  Administered 2023-03-17: 50 mg via INTRAMUSCULAR
  Filled 2023-03-17: qty 1

## 2023-03-17 MED ORDER — AMLODIPINE BESYLATE 5 MG PO TABS: 5.0000 mg | ORAL_TABLET | Freq: Every day | ORAL | 0 refills | Status: AC

## 2023-03-17 MED ORDER — AMLODIPINE BESYLATE 5 MG PO TABS: 5.0000 mg | ORAL_TABLET | Freq: Every day | ORAL | 3 refills | Status: AC

## 2023-03-17 MED ORDER — POTASSIUM CHLORIDE CRYS ER 20 MEQ PO TBCR: 20.0000 meq | EXTENDED_RELEASE_TABLET | Freq: Once | ORAL | Status: DC

## 2023-03-17 MED ORDER — POTASSIUM CHLORIDE CRYS ER 20 MEQ PO TBCR: 20.0000 meq | EXTENDED_RELEASE_TABLET | Freq: Two times a day (BID) | ORAL | 0 refills | Status: AC

## 2023-03-17 MED ORDER — POTASSIUM CHLORIDE CRYS ER 20 MEQ PO TBCR
40.0000 meq | EXTENDED_RELEASE_TABLET | Freq: Once | ORAL | Status: AC
  Administered 2023-03-17: 40 meq via ORAL
  Filled 2023-03-17: qty 2

## 2023-03-17 MED ORDER — HYDRALAZINE HCL 20 MG/ML IJ SOLN
10.0000 mg | Freq: Once | INTRAMUSCULAR | Status: AC
  Administered 2023-03-17: 10 mg via INTRAVENOUS
  Filled 2023-03-17: qty 1

## 2023-03-17 NOTE — MAU Provider Note (Addendum)
 History     CSN: [id number]  Arrival date and time: 03/17/23 1919   Event Date/Time   First Provider Initiated Contact with Patient 03/17/23 1931      Chief Complaint  Patient presents with   Vaginal Bleeding   HPI Marissa Boyle is a 33 y.o. year old G1P0 female at [redacted]w[redacted]d weeks gestation who presents to MAU via EMS reporting she had surgery to remove an ectopic pregnancy on the morning of 03/13/2023. She has a report of H/A and pelvic pain today. She took Oxycodone 5 mg at 1000 today for pain. She does report some flu-like sx's; including body aches.   RN documented: noting difficulty speaking and completing thoughts and appearing agitated. A&O x4.  OB History     Gravida  1   Para      Term      Preterm      AB  1   Living         SAB      IAB      Ectopic  1   Multiple      Live Births              History reviewed. No pertinent past medical history.  Past Surgical History:  Procedure Laterality Date   CHOLECYSTECTOMY     DIAGNOSTIC LAPAROSCOPY WITH REMOVAL OF ECTOPIC PREGNANCY N/A 03/13/2023   Procedure: DIAGNOSTIC LAPAROSCOPY WITH REMOVAL OF ECTOPIC PREGNANCY;  Surgeon: Warden Fillers, MD;  Location: [facility] OR;  Service: Gynecology;  Laterality: N/A;   LAPAROSCOPIC BILATERAL SALPINGECTOMY Left 03/13/2023   Procedure: LAPAROSCOPIC SALPINGECTOMY;  Surgeon: Warden Fillers, MD;  Location: [facility] OR;  Service: Gynecology;  Laterality: Left;    History reviewed. No pertinent family history.  Social History   Tobacco Use   Smoking status: Every Day    Current packs/day: 0.25    Types: Cigarettes   Smokeless tobacco: Never  Vaping Use   Vaping status: Never Used  Substance Use Topics   Alcohol use: Yes   Drug use: Yes    Types: Marijuana    Allergies: No Known Allergies  Medications Prior to Admission  Medication Sig Dispense Refill Last Dose/Taking   ibuprofen (ADVIL) 600 MG tablet Take 1 tablet (600 mg total) by mouth every 6 (six) hours  as needed for headache, mild pain (pain score 1-3), moderate pain (pain score 4-6) or cramping. 30 tablet 2    oxyCODONE (OXY IR/ROXICODONE) 5 MG immediate release tablet Take 1 tablet (5 mg total) by mouth every 6 (six) hours as needed for severe pain (pain score 7-10) or breakthrough pain. 15 tablet 0     Review of Systems  Constitutional: Negative.   HENT: Negative.    Eyes: Negative.   Respiratory: Negative.    Cardiovascular: Negative.   Gastrointestinal: Negative.   Endocrine: Negative.   Genitourinary:  Positive for pelvic pain and vaginal bleeding ("blood running out vagina"; has videos on phone).  Musculoskeletal: Negative.   Skin: Negative.   Allergic/Immunologic: Negative.   Neurological: Negative.   Hematological: Negative.   Psychiatric/Behavioral: Negative.     Physical Exam   Patient Vitals for the past 24 hrs:  BP Temp Temp src Pulse Resp SpO2 Height Weight  03/17/23 2130 (!) 146/97 -- -- (!) 101 -- -- -- --  03/17/23 2112 (!) 153/113 -- -- (!) 103 -- -- -- --  03/17/23 2110 (!) 175/116 -- -- (!) 105 -- -- -- --  03/17/23 2100 Marland Kitchen)  158/115 -- -- (!) 107 -- -- -- --  03/17/23 2050 (!) 153/108 -- -- (!) 104 -- -- -- --  03/17/23 2030 (!) 155/103 -- -- (!) 101 (!) 22 -- -- --  03/17/23 2020 (!) 148/94 -- -- (!) 106 -- 100 % -- --  03/17/23 2015 -- -- -- -- -- 99 % -- --  03/17/23 2010 (!) 145/106 -- -- (!) 109 -- 99 % -- --  03/17/23 2000 (!) 168/111 -- -- (!) 106 (!) 22 99 % -- --  03/17/23 1950 (!) 157/105 -- -- (!) 116 -- 100 % -- --  03/17/23 1945 -- -- -- -- -- 99 % -- --  03/17/23 1940 (!) 160/114 98.4 F (36.9 C) Oral (!) 115 18 -- -- --  03/17/23 1930 (!) 166/139 -- -- (!) 114 18 -- -- --  03/17/23 1928 -- -- -- -- -- 100 % 5\' 2"  (1.575 m) 59 kg  03/17/23 1925 (!) 173/116 -- -- (!) 121 18 100 % -- --     Physical Exam Vitals and nursing note reviewed. Exam conducted with a chaperone present.  Constitutional:      Appearance: Normal appearance. She is  normal weight.  Cardiovascular:     Rate and Rhythm: Tachycardia present.  Pulmonary:     Effort: Pulmonary effort is normal.  Abdominal:     General: Abdomen is flat.     Palpations: Abdomen is soft.  Genitourinary:    General: Normal vulva.     Comments: Pelvic exam: External genitalia normal, SE: vaginal walls pink and well rugated, cervix is smooth, pink, no lesions, small to moderate amount of dark, red blood in vaginal  -- cervix visually closed, bimanual deferred. Musculoskeletal:        General: Normal range of motion.  Skin:    General: Skin is warm and dry.  Neurological:     Mental Status: She is alert and oriented to person, place, and time.  Psychiatric:        Attention and Perception: Attention and perception normal.        Mood and Affect: Mood is anxious. Affect is flat.        Speech: Speech normal.        Behavior: Behavior is withdrawn. Behavior is cooperative.        Thought Content: Thought content normal.        Cognition and Memory: Cognition and memory normal.        Judgment: Judgment normal.    MAU Course  Procedures  MDM Start IV LR 1 liter at 999 ml/hr CBC CMP HCG K-Dur 40 mEq po Hydrocodone 5 mg po  Hydralazine 10 mg IVP  Results for orders placed or performed during the hospital encounter of 03/17/23 (from the past 24 hours)  CBC     Status: Abnormal   Collection Time: 03/17/23  7:43 PM  Result Value Ref Range   WBC 12.5 (H) 4.0 - 10.5 K/uL   RBC 4.28 3.87 - 5.11 MIL/uL   Hemoglobin 12.7 12.0 - 15.0 g/dL   HCT 54.0 98.1 - 19.1 %   MCV 87.4 80.0 - 100.0 fL   MCH 29.7 26.0 - 34.0 pg   MCHC 34.0 30.0 - 36.0 g/dL   RDW 47.8 29.5 - 62.1 %   Platelets 444 (H) 150 - 400 K/uL   nRBC 0.0 0.0 - 0.2 %  Comprehensive metabolic panel     Status: Abnormal   Collection Time: 03/17/23  7:43 PM  Result Value Ref Range   Sodium 133 (L) 135 - 145 mmol/L   Potassium 2.3 (LL) 3.5 - 5.1 mmol/L   Chloride 97 (L) 98 - 111 mmol/L   CO2 25 22 - 32  mmol/L   Glucose, Bld 130 (H) 70 - 99 mg/dL   BUN 6 6 - 20 mg/dL   Creatinine, Ser 2.95 0.44 - 1.00 mg/dL   Calcium 9.4 8.9 - 28.4 mg/dL   Total Protein 8.4 (H) 6.5 - 8.1 g/dL   Albumin 4.7 3.5 - 5.0 g/dL   AST 18 15 - 41 U/L   ALT 14 0 - 44 U/L   Alkaline Phosphatase 55 38 - 126 U/L   Total Bilirubin 1.2 0.0 - 1.2 mg/dL   GFR, Estimated >13 >24 mL/min   Anion gap 11 5 - 15  hCG, quantitative, pregnancy     Status: Abnormal   Collection Time: 03/17/23  7:43 PM  Result Value Ref Range   hCG, Beta Chain, Quant, S 31 (H) <5 mIU/mL    *Consult with Dr. Charlotta Newton @ 2045 - notified of patient's complaints, assessments, lab results, recommended tx plan give K-Dur 40 mEq po and pain medication - ok to d/c home  Consult to Dr. Charlotta Newton @ 2145 about HTN medication Rx - orders received ro Rx Norvasc 5 mg po every day.  Assessment and Plan  1. Postoperative pain (Primary) - Continue Ibuprofen and Oxycodone as previously Rx'd  2. Vaginal bleeding - Return to MAU: If you have heavier bleeding that soaks through more that 2 pads per hour for an hour or more If you bleed so much that you feel like you might pass out or you do pass out If you have significant abdominal pain that is not improved with Tylenol 1000 mg every 8 hours as needed for pain If you develop a fever > 100.5   3. Status post ectopic pregnancy - F/U in 1-2 weeks at [facility]  4. Secondary hypertension - Prescription for: Norvasc 5 mg po daily sent to pharmacy - Advised to make sure she is taking it daily for lowering BP  5. Hypokalemia - Prescription for K-Dur 20 mEq po BID  - Discharge patient - Patient verbalized an understanding of the plan of care and agrees.   Raelyn Mora, CNM 03/17/2023, 7:31 PM

## 2023-03-17 NOTE — Discharge Instructions (Addendum)
 You now been prescribed medication to help lower your blood pressure. You will need to pick it up at your pharmacy. Please make sure you are taking it everyday.  Return to MAU: If you have heavier bleeding that soaks through more that 2 pads per hour for an hour or more If you bleed so much that you feel like you might pass out or you do pass out If you have significant abdominal pain that is not improved with Tylenol 1000 mg every 8 hours as needed for pain If you develop a fever > 100.5

## 2023-03-17 NOTE — MAU Note (Signed)
..  Marissa Boyle is a 32 y.o. at [redacted]w[redacted]d here in MAU reporting: came in via EMS with bleeding and clots from vagina starting today. Had surgery on the 11th for an ectopic pregnancy.   Pt reports recent history of high blood pressure. Reports headaches and pelvic pain. Denies vision changes. Reports recent flu symptoms including body aches.  RN observes difficulty speaking and completing thoughts. Pt appears agitated. Pt is alert and oriented x4.   Pain score: 6/10 Vitals:   03/17/23 1930 03/17/23 1940  BP: (!) 166/139 (!) 160/114  Pulse: (!) 114 (!) 115  Resp: 18 18  Temp:  98.4 F (36.9 C)  SpO2:       FHT:na Lab orders placed from triage: na

## 2023-03-25 ENCOUNTER — Other Ambulatory Visit: Payer: Self-pay | Admitting: Obstetrics & Gynecology

## 2023-03-25 DIAGNOSIS — G8918 Other acute postprocedural pain: Secondary | ICD-10-CM

## 2023-03-29 ENCOUNTER — Other Ambulatory Visit (INDEPENDENT_AMBULATORY_CARE_PROVIDER_SITE_OTHER): Payer: Self-pay | Admitting: Nurse Practitioner

## 2023-03-30 DIAGNOSIS — Z419 Encounter for procedure for purposes other than remedying health state, unspecified: Secondary | ICD-10-CM | POA: Diagnosis not present

## 2023-04-04 ENCOUNTER — Ambulatory Visit: Payer: Self-pay | Admitting: Obstetrics and Gynecology

## 2023-05-11 DIAGNOSIS — Z419 Encounter for procedure for purposes other than remedying health state, unspecified: Secondary | ICD-10-CM | POA: Diagnosis not present

## 2023-06-10 DIAGNOSIS — Z419 Encounter for procedure for purposes other than remedying health state, unspecified: Secondary | ICD-10-CM | POA: Diagnosis not present

## 2023-07-11 DIAGNOSIS — Z419 Encounter for procedure for purposes other than remedying health state, unspecified: Secondary | ICD-10-CM | POA: Diagnosis not present

## 2023-08-10 DIAGNOSIS — Z419 Encounter for procedure for purposes other than remedying health state, unspecified: Secondary | ICD-10-CM | POA: Diagnosis not present

## 2023-09-10 DIAGNOSIS — Z419 Encounter for procedure for purposes other than remedying health state, unspecified: Secondary | ICD-10-CM | POA: Diagnosis not present

## 2023-10-11 DIAGNOSIS — Z419 Encounter for procedure for purposes other than remedying health state, unspecified: Secondary | ICD-10-CM | POA: Diagnosis not present

## 2023-11-10 DIAGNOSIS — Z419 Encounter for procedure for purposes other than remedying health state, unspecified: Secondary | ICD-10-CM | POA: Diagnosis not present

## 2023-12-11 DIAGNOSIS — Z419 Encounter for procedure for purposes other than remedying health state, unspecified: Secondary | ICD-10-CM | POA: Diagnosis not present

## 2023-12-21 DIAGNOSIS — Z79899 Other long term (current) drug therapy: Secondary | ICD-10-CM | POA: Diagnosis not present

## 2023-12-21 DIAGNOSIS — K529 Noninfective gastroenteritis and colitis, unspecified: Secondary | ICD-10-CM | POA: Diagnosis not present

## 2023-12-21 DIAGNOSIS — F1290 Cannabis use, unspecified, uncomplicated: Secondary | ICD-10-CM | POA: Diagnosis not present

## 2024-01-10 DIAGNOSIS — Z419 Encounter for procedure for purposes other than remedying health state, unspecified: Secondary | ICD-10-CM | POA: Diagnosis not present
